# Patient Record
Sex: Female | Born: 1945 | Race: White | Hispanic: No | Marital: Married | Smoking: Never smoker
Health system: Southern US, Community
[De-identification: ages and names within clinical notes are randomized; demographics above are authoritative.]

## PROBLEM LIST (undated history)

## (undated) DIAGNOSIS — I10 Essential (primary) hypertension: Secondary | ICD-10-CM

## (undated) HISTORY — PX: JOINT REPLACEMENT: SHX530

---

## 2016-05-08 ENCOUNTER — Emergency Department: Payer: Medicare Other

## 2016-05-08 ENCOUNTER — Inpatient Hospital Stay
Admission: EM | Admit: 2016-05-08 | Discharge: 2016-05-12 | DRG: 871 | Disposition: A | Discharge status: Home Health | Payer: Medicare Other | Attending: Internal Medicine | Admitting: Internal Medicine

## 2016-05-08 ENCOUNTER — Encounter: Payer: Self-pay | Admitting: Emergency Medicine

## 2016-05-08 DIAGNOSIS — I272 Pulmonary hypertension, unspecified: Secondary | ICD-10-CM | POA: Diagnosis present

## 2016-05-08 DIAGNOSIS — Y9229 Other specified public building as the place of occurrence of the external cause: Secondary | ICD-10-CM | POA: Diagnosis not present

## 2016-05-08 DIAGNOSIS — I482 Chronic atrial fibrillation: Secondary | ICD-10-CM | POA: Diagnosis present

## 2016-05-08 DIAGNOSIS — I129 Hypertensive chronic kidney disease with stage 1 through stage 4 chronic kidney disease, or unspecified chronic kidney disease: Secondary | ICD-10-CM | POA: Diagnosis present

## 2016-05-08 DIAGNOSIS — A419 Sepsis, unspecified organism: Secondary | ICD-10-CM | POA: Diagnosis not present

## 2016-05-08 DIAGNOSIS — M79606 Pain in leg, unspecified: Secondary | ICD-10-CM | POA: Diagnosis present

## 2016-05-08 DIAGNOSIS — M546 Pain in thoracic spine: Secondary | ICD-10-CM | POA: Diagnosis present

## 2016-05-08 DIAGNOSIS — R6521 Severe sepsis with septic shock: Secondary | ICD-10-CM | POA: Diagnosis present

## 2016-05-08 DIAGNOSIS — W1839XA Other fall on same level, initial encounter: Secondary | ICD-10-CM | POA: Diagnosis present

## 2016-05-08 DIAGNOSIS — N183 Chronic kidney disease, stage 3 (moderate): Secondary | ICD-10-CM | POA: Diagnosis present

## 2016-05-08 DIAGNOSIS — Z95828 Presence of other vascular implants and grafts: Secondary | ICD-10-CM

## 2016-05-08 DIAGNOSIS — G629 Polyneuropathy, unspecified: Secondary | ICD-10-CM | POA: Diagnosis present

## 2016-05-08 DIAGNOSIS — Z6841 Body Mass Index (BMI) 40.0 and over, adult: Secondary | ICD-10-CM | POA: Diagnosis not present

## 2016-05-08 DIAGNOSIS — R0600 Dyspnea, unspecified: Secondary | ICD-10-CM | POA: Diagnosis not present

## 2016-05-08 DIAGNOSIS — N390 Urinary tract infection, site not specified: Secondary | ICD-10-CM | POA: Diagnosis present

## 2016-05-08 DIAGNOSIS — B9620 Unspecified Escherichia coli [E. coli] as the cause of diseases classified elsewhere: Secondary | ICD-10-CM | POA: Diagnosis present

## 2016-05-08 DIAGNOSIS — Z7901 Long term (current) use of anticoagulants: Secondary | ICD-10-CM

## 2016-05-08 DIAGNOSIS — I82419 Acute embolism and thrombosis of unspecified femoral vein: Secondary | ICD-10-CM | POA: Diagnosis present

## 2016-05-08 DIAGNOSIS — R34 Anuria and oliguria: Secondary | ICD-10-CM | POA: Diagnosis present

## 2016-05-08 DIAGNOSIS — E8770 Fluid overload, unspecified: Secondary | ICD-10-CM | POA: Diagnosis present

## 2016-05-08 DIAGNOSIS — N179 Acute kidney failure, unspecified: Secondary | ICD-10-CM | POA: Diagnosis present

## 2016-05-08 DIAGNOSIS — Z79899 Other long term (current) drug therapy: Secondary | ICD-10-CM

## 2016-05-08 DIAGNOSIS — E875 Hyperkalemia: Secondary | ICD-10-CM | POA: Diagnosis present

## 2016-05-08 DIAGNOSIS — N189 Chronic kidney disease, unspecified: Secondary | ICD-10-CM

## 2016-05-08 DIAGNOSIS — I959 Hypotension, unspecified: Secondary | ICD-10-CM | POA: Diagnosis present

## 2016-05-08 DIAGNOSIS — Z8249 Family history of ischemic heart disease and other diseases of the circulatory system: Secondary | ICD-10-CM | POA: Diagnosis not present

## 2016-05-08 DIAGNOSIS — E872 Acidosis: Secondary | ICD-10-CM | POA: Diagnosis present

## 2016-05-08 DIAGNOSIS — Z96652 Presence of left artificial knee joint: Secondary | ICD-10-CM | POA: Diagnosis present

## 2016-05-08 HISTORY — DX: Essential (primary) hypertension: I10

## 2016-05-08 LAB — CBC WITH DIFFERENTIAL/PLATELET
Basophils Absolute: 0.1 10*3/uL (ref 0–0.1)
Basophils Relative: 1 %
EOS ABS: 0.2 10*3/uL (ref 0–0.7)
Eosinophils Relative: 2 %
HEMATOCRIT: 29.6 % — AB (ref 35.0–47.0)
HEMOGLOBIN: 9.9 g/dL — AB (ref 12.0–16.0)
Lymphocytes Relative: 14 %
Lymphs Abs: 1.5 10*3/uL (ref 1.0–3.6)
MCH: 30.4 pg (ref 26.0–34.0)
MCHC: 33.6 g/dL (ref 32.0–36.0)
MCV: 90.5 fL (ref 80.0–100.0)
MONOS PCT: 9 %
Monocytes Absolute: 1 10*3/uL — ABNORMAL HIGH (ref 0.2–0.9)
NEUTROS ABS: 7.8 10*3/uL — AB (ref 1.4–6.5)
NEUTROS PCT: 74 %
Platelets: 254 10*3/uL (ref 150–440)
RBC: 3.26 MIL/uL — AB (ref 3.80–5.20)
RDW: 14.5 % (ref 11.5–14.5)
WBC: 10.5 10*3/uL (ref 3.6–11.0)

## 2016-05-08 LAB — BASIC METABOLIC PANEL
ANION GAP: 11 (ref 5–15)
BUN: 76 mg/dL — ABNORMAL HIGH (ref 6–20)
CO2: 18 mmol/L — AB (ref 22–32)
Calcium: 7.9 mg/dL — ABNORMAL LOW (ref 8.9–10.3)
Chloride: 100 mmol/L — ABNORMAL LOW (ref 101–111)
Creatinine, Ser: 6.7 mg/dL — ABNORMAL HIGH (ref 0.44–1.00)
GFR calc non Af Amer: 6 mL/min — ABNORMAL LOW (ref >60)
GFR, EST AFRICAN AMERICAN: 6 mL/min — AB (ref >60)
Glucose, Bld: 93 mg/dL (ref 65–99)
Potassium: 5.3 mmol/L — ABNORMAL HIGH (ref 3.5–5.1)
Sodium: 129 mmol/L — ABNORMAL LOW (ref 135–145)

## 2016-05-08 LAB — COMPREHENSIVE METABOLIC PANEL
ALBUMIN: 3.2 g/dL — AB (ref 3.5–5.0)
ALK PHOS: 51 U/L (ref 38–126)
ALT: 24 U/L (ref 14–54)
AST: 27 U/L (ref 15–41)
Anion gap: 15 (ref 5–15)
BILIRUBIN TOTAL: 0.4 mg/dL (ref 0.3–1.2)
BUN: 81 mg/dL — ABNORMAL HIGH (ref 6–20)
CALCIUM: 8.3 mg/dL — AB (ref 8.9–10.3)
CO2: 17 mmol/L — AB (ref 22–32)
CREATININE: 7.13 mg/dL — AB (ref 0.44–1.00)
Chloride: 100 mmol/L — ABNORMAL LOW (ref 101–111)
GFR calc non Af Amer: 5 mL/min — ABNORMAL LOW (ref >60)
GFR, EST AFRICAN AMERICAN: 6 mL/min — AB (ref >60)
GLUCOSE: 95 mg/dL (ref 65–99)
Potassium: 5.3 mmol/L — ABNORMAL HIGH (ref 3.5–5.1)
SODIUM: 132 mmol/L — AB (ref 135–145)
TOTAL PROTEIN: 6.9 g/dL (ref 6.5–8.1)

## 2016-05-08 LAB — PROCALCITONIN

## 2016-05-08 LAB — GLUCOSE, CAPILLARY: Glucose-Capillary: 94 mg/dL (ref 65–99)

## 2016-05-08 LAB — PHOSPHORUS: Phosphorus: 9.1 mg/dL — ABNORMAL HIGH (ref 2.5–4.6)

## 2016-05-08 LAB — LACTIC ACID, PLASMA: Lactic Acid, Venous: 0.8 mmol/L (ref 0.5–1.9)

## 2016-05-08 LAB — MAGNESIUM: Magnesium: 2.2 mg/dL (ref 1.7–2.4)

## 2016-05-08 LAB — TROPONIN I: Troponin I: <0.03 ng/mL (ref <0.03)

## 2016-05-08 MED ORDER — APIXABAN 5 MG PO TABS
5.0000 mg | ORAL_TABLET | Freq: Two times a day (BID) | ORAL | Status: DC
  Administered 2016-05-08 – 2016-05-12 (×8): 5 mg via ORAL
  Filled 2016-05-08: qty 1
  Filled 2016-05-08: qty 2
  Filled 2016-05-08: qty 1
  Filled 2016-05-08: qty 2
  Filled 2016-05-08: qty 1
  Filled 2016-05-08: qty 2
  Filled 2016-05-08: qty 1
  Filled 2016-05-08: qty 2

## 2016-05-08 MED ORDER — SODIUM CHLORIDE 0.9 % IV SOLN
0.0000 ug/min | INTRAVENOUS | Status: DC
  Administered 2016-05-08 – 2016-05-09 (×2): 20 ug/min via INTRAVENOUS
  Filled 2016-05-08 (×2): qty 1

## 2016-05-08 MED ORDER — SODIUM CHLORIDE 0.9 % IV BOLUS (SEPSIS)
1000.0000 mL | Freq: Once | INTRAVENOUS | Status: AC
  Administered 2016-05-08: 1000 mL via INTRAVENOUS

## 2016-05-08 MED ORDER — FAMOTIDINE IN NACL 20-0.9 MG/50ML-% IV SOLN
20.0000 mg | Freq: Two times a day (BID) | INTRAVENOUS | Status: DC
  Administered 2016-05-08: 20 mg via INTRAVENOUS
  Filled 2016-05-08 (×2): qty 50

## 2016-05-08 MED ORDER — VANCOMYCIN HCL IN DEXTROSE 1-5 GM/200ML-% IV SOLN
1000.0000 mg | Freq: Once | INTRAVENOUS | Status: AC
  Administered 2016-05-08: 1000 mg via INTRAVENOUS
  Filled 2016-05-08: qty 200

## 2016-05-08 MED ORDER — HYDROCORTISONE NA SUCCINATE PF 100 MG IJ SOLR
50.0000 mg | Freq: Three times a day (TID) | INTRAMUSCULAR | Status: DC
  Administered 2016-05-08 – 2016-05-10 (×6): 50 mg via INTRAVENOUS
  Filled 2016-05-08 (×6): qty 2

## 2016-05-08 MED ORDER — SODIUM CHLORIDE 0.9 % IV SOLN
INTRAVENOUS | Status: DC
  Administered 2016-05-08 – 2016-05-10 (×4): via INTRAVENOUS

## 2016-05-08 MED ORDER — PIPERACILLIN SOD-TAZOBACTAM SO 2.25 (2-0.25) G IV SOLR
3.3750 g | Freq: Once | INTRAVENOUS | Status: AC
  Administered 2016-05-08: 3.375 g via INTRAVENOUS
  Filled 2016-05-08 (×2): qty 3.38

## 2016-05-08 NOTE — Progress Notes (Signed)
Family Meeting Note  Advance Directive:no  Today a meeting took place with the Patient.  The following clinical team members were present during this meeting:MD  The following were discussed:Patient's diagnosis: , Patient's progosis: > 12 months and Goals for treatment: Full Code  Additional follow-up to be provided: Full code, continue goals of care discussion  Time spent during discussion:20 minutes  Delfino Lovett, MD

## 2016-05-08 NOTE — Progress Notes (Signed)
Met with patient's family at her bedside. They expressed the desire to have patient transferred to [facility] in [place], [place] so she can be close to home. Patient has been hospitalized multiple times in this hospital. Nursing supervisor at [facility] contacted(Mr. Davene Costain [phone]) and bed request initiated. They want [place] to contact them once patient is stable for transfer. Family updated on transfer status   Keon Benscoter S. [facility] ANP-BC Pulmonary and Critical Care Medicine [facility] Pager [phone] or [phone]

## 2016-05-08 NOTE — Consult Note (Signed)
[facility] Critical Care Medicine Consultation    SYNOPSIS   71 yo female with CKD now presents with weakness, severe hypotension of uncertain etiology, AKI on CKD.   ASSESSMENT/PLAN     CARDIOVASCULAR A: Septic shock with hypotension.  Afib on eliquis.  Essential hypertension.  P:  --Continue pressors, IVF resuscitation.  --Will add stress dose steroids.  --Will check LE dopplers, given long car ride, and recent knee surgery. However is on long term eliquis, therefore PE seems less likely.  --No CT angio secondary to ARF.      RENAL A:  ARF on CKD.  P:   --Has CKD, followed by nephro outpatient.  --Will consider nephrology consult if renal function does not improve.    INTAKE / OUTPUT:  Intake/Output Summary (Last 24 hours) at 05/08/16 1800 Last data filed at 05/08/16 1759  Gross per 24 hour  Intake             2000 ml  Output                0 ml  Net             2000 ml    GASTROINTESTINAL A:  -- P:   --  HEMATOLOGIC A:  -- P:  --  INFECTIOUS A:  ?Possible septic shock.  P:   Empiric abx.  Cultures.   Micro/culture results:  BCx2 -- UC -- Sputum--  Antibiotics: Vancomycin 4/2>>  Zosyn 4/2>>  ENDOCRINE A:  Monitor glucose   P:   --  NEUROLOGIC A:  -- P:   RASS goal: -- --   MAJOR EVENTS/TEST RESULTS:   Best Practices  DVT Prophylaxis: -- GI Prophylaxis: --  ---------------------------------------  ---------------------------------------   Name: Armandina Iman MRN: [id number] DOB: [birth year]    ADMISSION DATE:  05/08/2016 CONSULTATION DATE:  05/08/16  REFERRING MD :  Dr. Sherryll Burger  CHIEF COMPLAINT:  dyspnea   HISTORY OF PRESENT ILLNESS:    The patient is a 71 yo female, she lives in [place], and was travelling with her family back from [place], they had been on the road for about 2.5 hours when she stopped at rest stop for routine stop. They note that maybe she has been feeling somewhat woozy this am.  When  at the rest stop she felt woozy and her legs gave way though she did not fall. She was noted to be dizzy therefore EMS was called.  She has no history of PE or DVT. She had a knee replaced about 5 months ago. She has CKD and sees a renal specialist where she lives, over the past few months her renal function has ranged from CKD 1 to 3.  Currently she is very tired, but she has no particular complaints, she denies chest pain or pressure. She is laying in bed, somewhat sleepy but in no distess and on room air, with sat of 99%.  However she has had persistent hypotension since her arrival. She received 3L of IVF, without continue hypotension, therefore she has been started on neosynephrine, her BP is now 94/53.   PAST MEDICAL HISTORY :  Past Medical History:  Diagnosis Date  . Hypertension    Past Surgical History:  Procedure Laterality Date  . JOINT REPLACEMENT     left knee surgery   Prior to Admission medications   Medication Sig Start Date End Date Taking? Authorizing Provider  amitriptyline (ELAVIL) 10 MG tablet Take 20 mg by mouth at bedtime.  Yes Historical Provider, MD  apixaban (ELIQUIS) 5 MG TABS tablet Take 5 mg by mouth 2 (two) times daily.   Yes Historical Provider, MD  diltiazem (DILACOR XR) 180 MG 24 hr capsule Take 180 mg by mouth daily.   Yes Historical Provider, MD  gabapentin (NEURONTIN) 600 MG tablet Take 600 mg by mouth 3 (three) times daily.   Yes Historical Provider, MD  lisinopril-hydrochlorothiazide (PRINZIDE,ZESTORETIC) 20-25 MG tablet Take 1 tablet by mouth daily.   Yes Historical Provider, MD  metoprolol succinate (TOPROL-XL) 100 MG 24 hr tablet Take 100 mg by mouth daily. Take with or immediately following a meal.   Yes Historical Provider, MD   No Known Allergies  FAMILY HISTORY:  No family history on file. SOCIAL HISTORY:  has no tobacco, alcohol, and drug history on file.  REVIEW OF SYSTEMS:   Constitutional: Feels well. Cardiovascular: No chest pain.    Pulmonary: Denies dyspnea.   The remainder of systems were reviewed and were found to be negative other than what is documented in the HPI.    VITAL SIGNS: Temp:  [97.7 F (36.5 C)] 97.7 F (36.5 C) (04/02 1421) Pulse Rate:  [56-131] 82 (04/02 1746) Resp:  [10-18] 10 (04/02 1746) BP: (76-122)/(39-103) 94/53 (04/02 1746) SpO2:  [75 %-99 %] 96 % (04/02 1746) Weight:  [305 lb (138.3 kg)] 305 lb (138.3 kg) (04/02 1423) HEMODYNAMICS:   VENTILATOR SETTINGS:   INTAKE / OUTPUT:  Intake/Output Summary (Last 24 hours) at 05/08/16 1800 Last data filed at 05/08/16 1759  Gross per 24 hour  Intake             2000 ml  Output                0 ml  Net             2000 ml    Physical Examination:   VS: BP (!) 94/53   Pulse 82   Temp 97.7 F (36.5 C) (Oral)   Resp 10   Ht  (1.702 m)   Wt (!) 305 lb (138.3 kg)   SpO2 96%   BMI 47.77 kg/m   General Appearance: No distress  Neuro:without focal findings, mental status, speech normal,. HEENT: PERRLA, EOM intact, no ptosis, no other lesions noticed;  Pulmonary: normal breath sounds., decreased air entry bilaterally  CardiovascularNormal S1,S2.  No m/r/g.    Abdomen: Benign, Soft, non-tender, No masses, hepatosplenomegaly,  Renal:  No costovertebral tenderness  GU:  Not performed at this time. Endoc: No evident thyromegaly, no signs of acromegaly. Skin:   warm, no rashes, no ecchymosis  Extremities: normal, no cyanosis, clubbing, no edema, warm with reduced capillary refill.    LABS: Reviewed   LABORATORY PANEL:   CBC  Recent Labs Lab 05/08/16 1432  WBC 10.5  HGB 9.9*  HCT 29.6*  PLT 254    Chemistries   Recent Labs Lab 05/08/16 1432  NA 132*  K 5.3*  CL 100*  CO2 17*  GLUCOSE 95  BUN 81*  CREATININE 7.13*  CALCIUM 8.3*  AST 27  ALT 24  ALKPHOS 51  BILITOT 0.4    No results for input(s): GLUCAP in the last 168 hours. No results for input(s): PHART, PCO2ART, PO2ART in the last 168 hours.  Recent  Labs Lab 05/08/16 1432  AST 27  ALT 24  ALKPHOS 51  BILITOT 0.4  ALBUMIN 3.2*    Cardiac Enzymes  Recent Labs Lab 05/08/16 1432  TROPONINI <0.03  RADIOLOGY:  Dg Chest 1 View  Result Date: 05/08/2016 CLINICAL DATA:  Weakness, multiple falls.  Alert and oriented. EXAM: CHEST 1 VIEW COMPARISON:  None. FINDINGS: There is mild right basilar atelectasis. There is no focal parenchymal opacity. There is no pleural effusion or pneumothorax. The heart and mediastinal contours are unremarkable. There is thoracic aortic atherosclerosis. The osseous structures are unremarkable. IMPRESSION: No active disease. Electronically Signed   By: Elige Ko   On: 05/08/2016 14:54       --Wells Guiles, MD.  Board Certified in Internal Medicine, Pulmonary Medicine, Critical Care Medicine, and Sleep Medicine.  ICU Pager [phone] [place] [facility] and Critical [facility] Office Number: [phone]  Santiago Glad, M.D.  Billy Fischer, M.D   05/08/2016, 6:00 PM

## 2016-05-08 NOTE — Progress Notes (Signed)
ANTICOAGULATION CONSULT NOTE - Initial Consult  Pharmacy Consult for Eliquis Indication: atrial fibrillation  No Known Allergies  Patient Measurements: Height:  (170.2 cm) Weight: (!) 305 lb (138.3 kg) IBW/kg (Calculated) : 61.6 Heparin Dosing Weight:   Vital Signs: Temp: 97.7 F (36.5 C) (04/02 1421) Temp Source: Oral (04/02 1421) BP: 95/69 (04/02 1815) Pulse Rate: 55 (04/02 1815)  Labs:  Recent Labs  05/08/16 1432  HGB 9.9*  HCT 29.6*  PLT 254  CREATININE 7.13*  TROPONINI <0.03    Estimated Creatinine Clearance: 10.5 mL/min (A) (by C-G formula based on SCr of 7.13 mg/dL (H)).   Medical History: Past Medical History:  Diagnosis Date  . Hypertension     Medications:  Prescriptions Prior to Admission  Medication Sig Dispense Refill Last Dose  . amitriptyline (ELAVIL) 10 MG tablet Take 20 mg by mouth at bedtime.   05/07/2016 at 2000  . apixaban (ELIQUIS) 5 MG TABS tablet Take 5 mg by mouth 2 (two) times daily.   05/08/2016 at 0800  . diltiazem (DILACOR XR) 180 MG 24 hr capsule Take 180 mg by mouth daily.   Unknown at Unknown  . gabapentin (NEURONTIN) 600 MG tablet Take 600 mg by mouth 3 (three) times daily.   Unknown at Unknown  . lisinopril-hydrochlorothiazide (PRINZIDE,ZESTORETIC) 20-25 MG tablet Take 1 tablet by mouth daily.   05/08/2016 at 0800  . metoprolol succinate (TOPROL-XL) 100 MG 24 hr tablet Take 100 mg by mouth daily. Take with or immediately following a meal.   Unknown at Unknown    Assessment: TBW = 138.3 kg, Age = 70 years, CrCl = 10.5 ml/min  Goal of Therapy:  prophylaxis of thromboembolism   Plan:  Eliquis 2.5 mg PO BID originally ordered.  Will adjust to Eliquis 5 mg PO BID.  OK'd with Dr Donette Larry.   Tamaira Ciriello D 05/08/2016,6:56 PM

## 2016-05-08 NOTE — Progress Notes (Signed)
Pharmacy Antibiotic Note  Lindsay Mathews is a 72 y.o. female admitted on 05/08/2016 with sepsis.  Pharmacy has been consulted for Zosyn and vancomycin dosing.  Plan: 1. Zosyn 3.375 gm IV Q12H EI 2. Vancomycin 1 gm IV x 1 in ED, will give additional 1 gm IV x 1 for a total 2 gm load x 1, followed in approximately 72 hours by vancomycin 1.5 gm IV Q72H, predicted trough 15 mcg/mL. Pharmacy will continue to follow and adjust as needed to maintain trough 15 to 20 mcg/mL.   Vd 64.4 L, Ke 0.013 hr-1, T1/2 52.8 hr  Height:  (170.2 cm) Weight: (!) 305 lb (138.3 kg) IBW/kg (Calculated) : 61.6  Temp (24hrs), Avg:97.7 F (36.5 C), Min:97.7 F (36.5 C), Max:97.7 F (36.5 C)   Recent Labs Lab 05/08/16 1432  WBC 10.5  CREATININE 7.13*    Estimated Creatinine Clearance: 10.5 mL/min (A) (by C-G formula based on SCr of 7.13 mg/dL (H)).    No Known Allergies  Thank you for allowing pharmacy to be a part of this patient's care.  Carola Frost, Pharm.D., BCPS Clinical Pharmacist 05/08/2016 6:16 PM

## 2016-05-08 NOTE — ED Triage Notes (Signed)
Pt presents to ED via EMS c/o of a her left knee "buckling" which caused her to fall. Pt also has back pain with BP <85 systolic. Alert and oriented x4

## 2016-05-08 NOTE — ED Provider Notes (Signed)
[facility] Emergency Department Provider Note  ____________________________________________   First MD Initiated Contact with Patient 05/08/16 1421     (approximate)  I have reviewed the triage vital signs and the nursing notes.   HISTORY  Chief Complaint Hypotension; Fall; and Knee Injury   HPI Lindsay Mathews is a 71 y.o. female with a history of hypertension who is presenting to the emergency department with a near syncopal episode. She says that she was getting up to walk from her car in a rest stop bathroom when she felt weak in her knees and fell to the ground. She says that she hit a bar on the bathroom wall to her upper back and is now having aching pain to the upper back. However, otherwise she is denying any pain. Denies that she completely passed out at that time. Says that she had had a biscuit and some water today which is normal for her daily routine. She denies any recent nausea vomiting or diarrhea. Said that she doesn't a history of kidney failure and has been taken off of her metformin. To the best of the memory of her daughter and the patient her last creatinine several months ago was about 2. Patient says that when she is lying in the bed that she is a symptomatic but when she gets up to walk that her symptoms began with a weakness in her bilateral lower extremities. The patient was on a trip from [place] back home on the [place] when this event happened. She denies any recent changes in her medications and says that she has been tolerating her medications for some time now. Recent knee replacement several months ago with sepsis thereafter and atrial fibrillation. Patient is currently on eliquis. Family said that the source was never identified for the sepsis but was not from the joint as far as anyone could tell. The replace was done to the left knee.   Past Medical History:  Diagnosis Date  . Hypertension     There are no active  problems to display for this patient.   Past Surgical History:  Procedure Laterality Date  . JOINT REPLACEMENT     left knee surgery    Prior to Admission medications   Not on File    Allergies Patient has no allergy information on record.  No family history on file.  Social History Social History  Substance Use Topics  . Smoking status: Not on file  . Smokeless tobacco: Not on file  . Alcohol use Not on file    Review of Systems Constitutional: No fever/chills Eyes: No visual changes. ENT: No sore throat. Cardiovascular: Denies chest pain. Respiratory: Denies shortness of breath. Gastrointestinal: No abdominal pain.  No nausea, no vomiting.  No diarrhea.  No constipation. Genitourinary: Negative for dysuria. Musculoskeletal:as above Skin: Negative for rash. Neurological: Negative for headaches, focal weakness or numbness.  10-point ROS otherwise negative.  ____________________________________________   PHYSICAL EXAM:  VITAL SIGNS: ED Triage Vitals  Enc Vitals Group     BP 05/08/16 1421 (!) 82/41     Pulse Rate 05/08/16 1421 63     Resp --      Temp 05/08/16 1421 97.7 F (36.5 C)     Temp Source 05/08/16 1421 Oral     SpO2 05/08/16 1421 94 %     Weight 05/08/16 1423 (!) 305 lb (138.3 kg)     Height 05/08/16 1423  (1.702 m)     Head Circumference --  Peak Flow --      Pain Score --      Pain Loc --      Pain Edu? --      Excl. in GC? --     Constitutional: Alert and oriented. Well appearing and in no acute distress. Eyes: Conjunctivae are normal. PERRL. EOMI. Head: Atraumatic. Nose: No congestion/rhinnorhea. Mouth/Throat: Mucous membranes are moist.   Neck: No stridor.   Cardiovascular: Normal rate, regular rhythm. Grossly normal heart sounds.  Good peripheral circulation With equal and bilateral dorsalis pedis pulses. Respiratory: Normal respiratory effort.  No retractions. Lungs CTAB. Gastrointestinal: Soft and nontender. No  distention.  Musculoskeletal: No lower extremity tenderness.  Moderate bilateral lower extremity edema which the patient says is been increasing since she has been sitting in her car.  No joint effusions. Left knee with anterior arthroplasty scar which is well-healed without any surrounding erythema, induration or pus. No effusion palpated. Full range of motion to the right lower extremity. Able to range the left lower extremity fully but with pain to the left knee and slight weakness which her and her daughter say that has been persistent since she had the knee replacement performed. Neurologic:  Normal speech and language. No gross focal neurologic deficits are appreciated.  Skin:  Skin is warm, dry and intact. No rash noted. Psychiatric: Mood and affect are normal. Speech and behavior are normal.  ____________________________________________   LABS (all labs ordered are listed, but only abnormal results are displayed)  Labs Reviewed  CBC WITH DIFFERENTIAL/PLATELET - Abnormal; Notable for the following:       Result Value   RBC 3.26 (*)    Hemoglobin 9.9 (*)    HCT 29.6 (*)    Neutro Abs 7.8 (*)    Monocytes Absolute 1.0 (*)    All other components within normal limits  COMPREHENSIVE METABOLIC PANEL - Abnormal; Notable for the following:    Sodium 132 (*)    Potassium 5.3 (*)    Chloride 100 (*)    CO2 17 (*)    BUN 81 (*)    Creatinine, Ser 7.13 (*)    Calcium 8.3 (*)    Albumin 3.2 (*)    GFR calc non Af Amer 5 (*)    GFR calc Af Amer 6 (*)    All other components within normal limits  TROPONIN I  URINALYSIS, COMPLETE (UACMP) WITH MICROSCOPIC   ____________________________________________  EKG  ED ECG REPORT I, Arelia Longest, the attending physician, personally viewed and interpreted this ECG.   Date: 05/08/2016  EKG Time: 1423  Rate: 65  Rhythm: normal sinus rhythm  Axis: Normal  Intervals:none  ST&T Change: No ST segment elevation or depression. No abnormal  T-wave inversion.  ____________________________________________  RADIOLOGY  DG Chest 1 View (Final result)  Result time 05/08/16 14:54:20  Final result by Joellyn Haff, MD (05/08/16 14:54:20)           Narrative:   CLINICAL DATA: Weakness, multiple falls. Alert and oriented.  EXAM: CHEST 1 VIEW  COMPARISON: None.  FINDINGS: There is mild right basilar atelectasis. There is no focal parenchymal opacity. There is no pleural effusion or pneumothorax. The heart and mediastinal contours are unremarkable. There is thoracic aortic atherosclerosis.  The osseous structures are unremarkable.  IMPRESSION: No active disease.   Electronically Signed By: Elige Ko On: 05/08/2016 14:54          ____________________________________________   PROCEDURES  Procedure(s) performed:   Procedures  Critical Care performed:   ____________________________________________   INITIAL IMPRESSION / ASSESSMENT AND PLAN / ED COURSE  Pertinent labs & imaging results that were available during my care of the patient were reviewed by me and considered in my medical decision making (see chart for details).  ----------------------------------------- 4:05 PM on 05/08/2016 -----------------------------------------  Patient found to have acute on chronic renal failure with a creatinine of 7 and BUN in the 80s. She also appears to be fluid responsive with her blood pressure now in the 80s. She will be admitted to the hospital. Signed out to Dr. Elpidio Anis. The diagnosis as well as treatment plan was explained to the patient and family and they're understanding and willing to comply.      ____________________________________________   FINAL CLINICAL IMPRESSION(S) / ED DIAGNOSES  Hypotension. Acute on chronic kidney failure.    NEW MEDICATIONS STARTED DURING THIS VISIT:  New Prescriptions   No medications on file     Note:  This document was prepared using Dragon voice  recognition software and may include unintentional dictation errors.    Myrna Blazer, MD 05/08/16 [phone]

## 2016-05-08 NOTE — H&P (Addendum)
[facility] - [place] at [facility]   PATIENT NAME: Lindsay Mathews    MR#:  [id number]  DATE OF BIRTH:  19-Jul-1945  DATE OF ADMISSION:  05/08/2016  PRIMARY CARE PHYSICIAN: PROVIDER NOT IN SYSTEM Normajean Glasgow MD  REQUESTING/REFERRING PHYSICIAN: Myrna Blazer, MD  CHIEF COMPLAINT:   Chief Complaint  Patient presents with  . Hypotension  . Fall  . Knee Injury   HISTORY OF PRESENT ILLNESS:  Lindsay Mathews  is a 71 y.o. 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 She says that she was getting up to walk from her car in a rest stop bathroom when she felt weak in her knees and fell to the ground. She says that she hit a bar on the bathroom wall to her upper back and is now having aching pain to the upper back. However, otherwise she is denying any pain. Denies that she completely passed out at that time. Says that she had had a biscuit and some water today which is normal for her daily routine. She denies any recent fever, nausea, vomiting or diarrhea. Said that she doesn't a history of kidney failure and has been taken off of her metformin. To the best of the memory of her daughter and the patient her last creatinine several months ago was about 2 and may have gone down to 1 while she was at rehab after knee surgery per her daughter.  The patient was on a trip from [place] back home to [place] when this event happened while at rest area. Had left knee replacement in November 17 and had to change knee cap in Jan'18 (?Sepsis/fall) and developed atrial fibrillation for which she is on eliquis.  PAST MEDICAL HISTORY:   Past Medical History:  Diagnosis Date  . Hypertension   A. Fib Fibromyalgia PAST SURGICAL HISTORY:   Past Surgical History:  Procedure Laterality Date  . JOINT REPLACEMENT     left knee surgery   SOCIAL HISTORY:   Social History  Substance Use  Topics  . Smoking status: Not on file  . Smokeless tobacco: Not on file  . Alcohol use Not on file  retired from [employer] FAMILY HISTORY:  No family history on file. Mom - HTN, SEPSIS (died from that) Sisters: MI DRUG ALLERGIES:  No Known Allergies REVIEW OF SYSTEMS:   Review of Systems  Constitutional: Positive for malaise/fatigue. Negative for chills, fever and weight loss.  HENT: Negative for nosebleeds and sore throat.   Eyes: Negative for blurred vision.  Respiratory: Negative for cough, shortness of breath and wheezing.   Cardiovascular: Negative for chest pain, orthopnea, leg swelling and PND.  Gastrointestinal: Negative for abdominal pain, constipation, diarrhea, heartburn, nausea and vomiting.  Genitourinary: Negative for dysuria and urgency.  Musculoskeletal: Positive for falls and joint pain. Negative for back pain.  Skin: Negative for rash.  Neurological: Positive for dizziness and weakness. Negative for speech change, focal weakness and headaches.  Endo/Heme/Allergies: Does not bruise/bleed easily.  Psychiatric/Behavioral: Negative for depression.   MEDICATIONS AT HOME:   Current Meds  Medication Sig  . amitriptyline (ELAVIL) 10 MG tablet Take 20 mg by mouth at bedtime.  Marland Kitchen apixaban (ELIQUIS) 5 MG TABS tablet Take 5 mg by mouth 2 (two) times daily.  Marland Kitchen diltiazem (DILACOR XR) 180 MG 24 hr capsule Take 180 mg by mouth daily.  Marland Kitchen gabapentin (NEURONTIN) 600 MG tablet Take 600 mg by  mouth 3 (three) times daily.  Marland Kitchen lisinopril-hydrochlorothiazide (PRINZIDE,ZESTORETIC) 20-25 MG tablet Take 1 tablet by mouth daily.  . metoprolol succinate (TOPROL-XL) 100 MG 24 hr tablet Take 100 mg by mouth daily. Take with or immediately following a meal.   VITAL SIGNS:  Blood pressure (!) 95/39, pulse (!) 131, temperature 97.7 F (36.5 C), temperature source Oral, resp. rate 14, height  (1.702 m), weight (!) 138.3 kg (305 lb), SpO2 (!) 85 %. PHYSICAL EXAMINATION:  Physical  Exam  GENERAL:  71 y.o.-year-old patient lying in the bed with no acute distress.  EYES: Pupils equal, round, reactive to light and accommodation. No scleral icterus. Extraocular muscles intact.  HEENT: Head atraumatic, normocephalic. Oropharynx and nasopharynx clear.  NECK:  Supple, no jugular venous distention. No thyroid enlargement, no tenderness.  LUNGS: Normal breath sounds bilaterally, no wheezing, rales,rhonchi or crepitation. No use of accessory muscles of respiration.  CARDIOVASCULAR: S1, S2 normal. No murmurs, rubs, or gallops.  ABDOMEN: Soft, nontender, nondistended. Bowel sounds present. No organomegaly or mass.  EXTREMITIES: No pedal edema, cyanosis, or clubbing.  NEUROLOGIC: Cranial nerves II through XII are intact. Muscle strength 5/5 in all extremities. Sensation intact. Gait not checked.  PSYCHIATRIC: The patient is alert and oriented x 3.  SKIN: No obvious rash, lesion, or ulcer.  LABORATORY PANEL:   CBC  Recent Labs Lab 05/08/16 1432  WBC 10.5  HGB 9.9*  HCT 29.6*  PLT 254   ------------------------------------------------------------------------------------------------------------------  Chemistries   Recent Labs Lab 05/08/16 1432  NA 132*  K 5.3*  CL 100*  CO2 17*  GLUCOSE 95  BUN 81*  CREATININE 7.13*  CALCIUM 8.3*  AST 27  ALT 24  ALKPHOS 51  BILITOT 0.4   ------------------------------------------------------------------------------------------------------------------  Cardiac Enzymes  Recent Labs Lab 05/08/16 1432  TROPONINI <0.03   ------------------------------------------------------------------------------------------------------------------  RADIOLOGY:  Dg Chest 1 View  Result Date: 05/08/2016 CLINICAL DATA:  Weakness, multiple falls.  Alert and oriented. EXAM: CHEST 1 VIEW COMPARISON:  None. FINDINGS: There is mild right basilar atelectasis. There is no focal parenchymal opacity. There is no pleural effusion or pneumothorax.  The heart and mediastinal contours are unremarkable. There is thoracic aortic atherosclerosis. The osseous structures are unremarkable. IMPRESSION: No active disease. Electronically Signed   By: Elige Ko   On: 05/08/2016 14:54   IMPRESSION AND PLAN:  71 year old female with a known history of hypertension, fibromyalgia, being admitted for near syncope and persistent hypotension  * Near syncope - Likely due to severe persistent hypotension  * severe persistent hypotension - Her blood pressure is still in 70s and 80s - Holding all her blood pressure medication - She has had already received 2 L of IV fluid boluses, we will start her on normal saline at 125 cc an hour - Admit her to stepdown unit - Phenylephrine drip per intensivist - Case discussed with e-Link physician, Dr. Dellie Catholic - Intensivist c/s - Also holding her eliquis not knowing If she would have any bleed - she is in normal sinus rhythm (was started for A.fib)  * Acute on chronic kidney disease 2/3 - may be her baseline creatinine is around 2, per discussion with her family members - This could be ATN due to severe hypotension - We will hydrate her aggressively, her creatinine today is 7.1 - Avoid any nephrotoxic medications - Nephrology consultation.  Case discussed with Dr. Cherylann Ratel  * Hyperkalemia - No EKG changes - We will repeat labs, my hope is with hydration.  Her kidney function  will improve and so will her potassium    All the records are reviewed and case discussed with ED provider. Management plans discussed with the patient, family (Discussed with her daughter Misty Stanley and her husband Chanetta Marshall at bedside) and they are in agreement.  CODE STATUS: FULL CODE  TOTAL TIME TAKING CARE OF THIS PATIENT: 45 minutes.    Delfino Lovett M.D on 05/08/2016 at 4:58 PM  Between 7am to 6pm - Pager - [phone]  After 6pm go to [email or web address] - Social research officer, government  [facility] [place] [facility]  Office   [phone]  CC: Primary care physician; PROVIDER NOT IN SYSTEM   Note: This dictation was prepared with Dragon dictation along with smaller phrase technology. Any transcriptional errors that result from this process are unintentional.

## 2016-05-08 NOTE — ED Notes (Signed)
Hooked pt up to monitor.

## 2016-05-09 ENCOUNTER — Inpatient Hospital Stay: Payer: Medicare Other

## 2016-05-09 ENCOUNTER — Inpatient Hospital Stay (HOSPITAL_COMMUNITY)
Admit: 2016-05-09 | Discharge: 2016-05-09 | Disposition: A | Discharge status: Home / Self Care | Payer: Medicare Other | Attending: Internal Medicine | Admitting: Internal Medicine

## 2016-05-09 DIAGNOSIS — I959 Hypotension, unspecified: Secondary | ICD-10-CM

## 2016-05-09 DIAGNOSIS — N189 Chronic kidney disease, unspecified: Secondary | ICD-10-CM

## 2016-05-09 DIAGNOSIS — R0600 Dyspnea, unspecified: Secondary | ICD-10-CM

## 2016-05-09 DIAGNOSIS — N179 Acute kidney failure, unspecified: Secondary | ICD-10-CM

## 2016-05-09 LAB — PROCALCITONIN: Procalcitonin: <0.1 ng/mL

## 2016-05-09 LAB — BASIC METABOLIC PANEL
ANION GAP: 9 (ref 5–15)
BUN: 74 mg/dL — ABNORMAL HIGH (ref 6–20)
CALCIUM: 7.4 mg/dL — AB (ref 8.9–10.3)
CO2: 17 mmol/L — ABNORMAL LOW (ref 22–32)
Chloride: 105 mmol/L (ref 101–111)
Creatinine, Ser: 6.15 mg/dL — ABNORMAL HIGH (ref 0.44–1.00)
GFR calc non Af Amer: 6 mL/min — ABNORMAL LOW (ref >60)
GFR, EST AFRICAN AMERICAN: 7 mL/min — AB (ref >60)
GLUCOSE: 116 mg/dL — AB (ref 65–99)
Potassium: 5.7 mmol/L — ABNORMAL HIGH (ref 3.5–5.1)
SODIUM: 131 mmol/L — AB (ref 135–145)

## 2016-05-09 LAB — URINALYSIS, COMPLETE (UACMP) WITH MICROSCOPIC
Bilirubin Urine: NEGATIVE
Glucose, UA: NEGATIVE mg/dL
Ketones, ur: NEGATIVE mg/dL
Nitrite: NEGATIVE
PROTEIN: NEGATIVE mg/dL
SQUAMOUS EPITHELIAL / LPF: NONE SEEN
Specific Gravity, Urine: 1.008 (ref 1.005–1.030)
pH: 5 (ref 5.0–8.0)

## 2016-05-09 LAB — CBC
HCT: 28.3 % — ABNORMAL LOW (ref 35.0–47.0)
Hemoglobin: 9.5 g/dL — ABNORMAL LOW (ref 12.0–16.0)
MCH: 30.4 pg (ref 26.0–34.0)
MCHC: 33.6 g/dL (ref 32.0–36.0)
MCV: 90.3 fL (ref 80.0–100.0)
PLATELETS: 221 10*3/uL (ref 150–440)
RBC: 3.13 MIL/uL — ABNORMAL LOW (ref 3.80–5.20)
RDW: 14.2 % (ref 11.5–14.5)
WBC: 7.3 10*3/uL (ref 3.6–11.0)

## 2016-05-09 LAB — LACTIC ACID, PLASMA: Lactic Acid, Venous: 0.7 mmol/L (ref 0.5–1.9)

## 2016-05-09 LAB — TROPONIN I
Troponin I: <0.03 ng/mL (ref <0.03)
Troponin I: <0.03 ng/mL (ref <0.03)

## 2016-05-09 LAB — PROTEIN / CREATININE RATIO, URINE
CREATININE, URINE: 72 mg/dL
Protein Creatinine Ratio: 0.36 mg/mg{Cre} — ABNORMAL HIGH (ref 0.00–0.15)
TOTAL PROTEIN, URINE: 26 mg/dL

## 2016-05-09 LAB — MRSA PCR SCREENING: MRSA by PCR: NEGATIVE

## 2016-05-09 LAB — MAGNESIUM: MAGNESIUM: 2.1 mg/dL (ref 1.7–2.4)

## 2016-05-09 MED ORDER — DEXTROSE 5 % IV SOLN
3.3750 g | Freq: Two times a day (BID) | INTRAVENOUS | Status: DC
  Filled 2016-05-09: qty 3.38

## 2016-05-09 MED ORDER — VANCOMYCIN HCL IN DEXTROSE 1-5 GM/200ML-% IV SOLN
1000.0000 mg | Freq: Once | INTRAVENOUS | Status: AC
  Administered 2016-05-09: 1000 mg via INTRAVENOUS
  Filled 2016-05-09: qty 200

## 2016-05-09 MED ORDER — PATIROMER SORBITEX CALCIUM 8.4 G PO PACK
16.8000 g | PACK | Freq: Every day | ORAL | Status: DC
  Administered 2016-05-09 – 2016-05-10 (×2): 16.8 g via ORAL
  Filled 2016-05-09 (×2): qty 4

## 2016-05-09 MED ORDER — VANCOMYCIN HCL 10 G IV SOLR: 1500.0000 mg | INTRAVENOUS | Status: DC

## 2016-05-09 MED ORDER — ACETAMINOPHEN 325 MG PO TABS
650.0000 mg | ORAL_TABLET | Freq: Four times a day (QID) | ORAL | Status: DC | PRN
  Administered 2016-05-09 – 2016-05-12 (×5): 650 mg via ORAL
  Filled 2016-05-09 (×6): qty 2

## 2016-05-09 MED ORDER — DOCUSATE SODIUM 100 MG PO CAPS
100.0000 mg | ORAL_CAPSULE | Freq: Two times a day (BID) | ORAL | Status: DC | PRN
  Administered 2016-05-11: 100 mg via ORAL
  Filled 2016-05-09: qty 1

## 2016-05-09 MED ORDER — PIPERACILLIN SOD-TAZOBACTAM SO 2.25 (2-0.25) G IV SOLR
3.3750 g | Freq: Two times a day (BID) | INTRAVENOUS | Status: DC
  Administered 2016-05-09 – 2016-05-10 (×4): 3.375 g via INTRAVENOUS
  Filled 2016-05-09 (×6): qty 3.38

## 2016-05-09 MED ORDER — FAMOTIDINE IN NACL 20-0.9 MG/50ML-% IV SOLN
20.0000 mg | INTRAVENOUS | Status: DC
  Administered 2016-05-09: 20 mg via INTRAVENOUS
  Filled 2016-05-09: qty 50

## 2016-05-09 NOTE — Progress Notes (Signed)
[facility] - [place] at [facility]   PATIENT NAME: Lindsay Mathews    MR#:  [id number]  DATE OF BIRTH:  [birth year]  SUBJECTIVE:   Patient doing well this morning. She denies chest pain or shortness of breath. She would like to go home if possible  REVIEW OF SYSTEMS:    Review of Systems  Constitutional: Negative.  Negative for chills, fever and malaise/fatigue.  HENT: Negative.  Negative for ear discharge, ear pain, hearing loss, nosebleeds and sore throat.   Eyes: Negative.  Negative for blurred vision and pain.  Respiratory: Negative.  Negative for cough, hemoptysis, shortness of breath and wheezing.   Cardiovascular: Negative.  Negative for chest pain, palpitations and leg swelling.  Gastrointestinal: Negative.  Negative for abdominal pain, blood in stool, diarrhea, nausea and vomiting.  Genitourinary: Negative.  Negative for dysuria.  Musculoskeletal: Negative.  Negative for back pain.  Skin: Negative.   Neurological: Negative for dizziness, tremors, speech change, focal weakness, seizures and headaches.  Endo/Heme/Allergies: Negative.  Does not bruise/bleed easily.  Psychiatric/Behavioral: Negative.  Negative for depression, hallucinations and suicidal ideas.    Tolerating Diet: yes      DRUG ALLERGIES:  No Known Allergies  VITALS:  Blood pressure (!) 107/57, pulse 60, temperature 97.5 F (36.4 C), temperature source Oral, resp. rate 16, height  (1.702 m), weight (!) 140.8 kg (310 lb 6.5 oz), SpO2 100 %.  PHYSICAL EXAMINATION:   Physical Exam  Constitutional: She is oriented to person, place, and time and well-developed, well-nourished, and in no distress. No distress.  HENT:  Head: Normocephalic.  Eyes: No scleral icterus.  Neck: Normal range of motion. Neck supple. No JVD present. No tracheal deviation present.  Cardiovascular: Normal rate, regular rhythm and normal heart sounds.  Exam reveals no gallop and no friction rub.   No  murmur heard. Pulmonary/Chest: Effort normal and breath sounds normal. No respiratory distress. She has no wheezes. She has no rales. She exhibits no tenderness.  Abdominal: Soft. Bowel sounds are normal. She exhibits no distension and no mass. There is no tenderness. There is no rebound and no guarding.  Musculoskeletal: Normal range of motion. She exhibits no edema.  Neurological: She is alert and oriented to person, place, and time.  Skin: Skin is warm. No rash noted. No erythema.  Psychiatric: Affect and judgment normal.   Left knee with anterior arthroplasty scar which is well-healed without any surrounding erythema, induration or pus.  Decreased ROM LEft leg   LABORATORY PANEL:   CBC  Recent Labs Lab 05/09/16 0443  WBC 7.3  HGB 9.5*  HCT 28.3*  PLT 221   ------------------------------------------------------------------------------------------------------------------  Chemistries   Recent Labs Lab 05/08/16 1432  05/09/16 0443  NA 132*  < > 131*  K 5.3*  < > 5.7*  CL 100*  < > 105  CO2 17*  < > 17*  GLUCOSE 95  < > 116*  BUN 81*  < > 74*  CREATININE 7.13*  < > 6.15*  CALCIUM 8.3*  < > 7.4*  MG  --   < > 2.1  AST 27  --   --   ALT 24  --   --   ALKPHOS 51  --   --   BILITOT 0.4  --   --   < > = values in this interval not displayed. ------------------------------------------------------------------------------------------------------------------  Cardiac Enzymes  Recent Labs Lab 05/08/16 1432 05/09/16 1050  TROPONINI <0.03 <0.03   ------------------------------------------------------------------------------------------------------------------  RADIOLOGY:  Dg Chest 1 View  Result Date: 05/08/2016 CLINICAL DATA:  Weakness, multiple falls.  Alert and oriented. EXAM: CHEST 1 VIEW COMPARISON:  None. FINDINGS: There is mild right basilar atelectasis. There is no focal parenchymal opacity. There is no pleural effusion or pneumothorax. The heart and mediastinal  contours are unremarkable. There is thoracic aortic atherosclerosis. The osseous structures are unremarkable. IMPRESSION: No active disease. Electronically Signed   By: Elige Ko   On: 05/08/2016 14:54   Dg Chest Port 1 View  Result Date: 05/09/2016 CLINICAL DATA:  Central line placement EXAM: PORTABLE CHEST 1 VIEW COMPARISON:  05/08/2016 FINDINGS: There is a new right jugular central line with tip in the expected location of the upper SVC. No pneumothorax. Stable curvilinear scarring or atelectasis in the right base. No confluent consolidation. Normal pulmonary vasculature. No large effusion. IMPRESSION: Right jugular central line reaches the upper SVC.  No pneumothorax. Electronically Signed   By: Ellery Plunk M.D.   On: 05/09/2016 00:44     ASSESSMENT AND PLAN:    71 year old female with recent left knee replacement, atrial fibrillation chronic kidney disease stage III who presented with hypotension after fall.  1 septic shock with severe hypotension and tachycardia on admission due to UTI Patient off pressors Follow-up on final blood cultures Wean hydrocortisone Continue Zosyn and vancomycin   2. Acute kidney injury in the setting of chronic kidney disease stage III with a baseline creatinine of 1.2 Acute kidney injury due to poor by mouth intake and use of nephrotoxic medications in addition to urinary tract infection Continue IV fluids Appreciate nephrology consultation  3. Hyperkalemia with metabolic acidosis Patient  on [place] daily as per nephrology  4. Syncope due to severe hypotension   Management plans discussed with the patient and she is in agreement.  CODE STATUS: full  TOTAL TIME TAKING CARE OF THIS PATIENT: 30 minutes.     POSSIBLE D/C 1-2 days, DEPENDING ON CLINICAL CONDITION.   Leniya Breit M.D on 05/09/2016 at 12:47 PM  Between 7am to 6pm - Pager - [phone] After 6pm go to [email or web address] - Social research officer, government  [facility] [place] [facility]   Office  [phone]  CC: Primary care physician; PROVIDER NOT IN SYSTEM  Note: This dictation was prepared with Dragon dictation along with smaller phrase technology. Any transcriptional errors that result from this process are unintentional.

## 2016-05-09 NOTE — Consult Note (Signed)
[facility] Critical Care Medicine Consultation    SYNOPSIS   71 yo female with CKD now presents with weakness, severe hypotension of uncertain etiology, AKI on CKD.   ASSESSMENT/PLAN     CARDIOVASCULAR A: Septic shock with hypotension.  Afib on eliquis.  Essential hypertension.  P:  --Continue pressors, IVF resuscitation.  --Continue stress dose steroids.  --Awaiting LE dopplers, given long car ride, and recent knee surgery. However is on long term eliquis, therefore PE seems less likely.  --No CT angio secondary to ARF.  --troponin x 1 negative; will trend.  --Echocardiogram pending.      RENAL A:  ARF on CKD with oliguria and hyperkalemia.  P:   --Has CKD, followed by nephro outpatient.  --Will consult nephrology.    INTAKE / OUTPUT:  Intake/Output Summary (Last 24 hours) at 05/09/16 0800 Last data filed at 05/09/16 0453  Gross per 24 hour  Intake             2000 ml  Output              425 ml  Net             1575 ml    GASTROINTESTINAL A:  -- P:   --  HEMATOLOGIC A:  -- P:  --  INFECTIOUS A:  ?Possible septic shock.  P:   Empiric abx.  Cultures.   Micro/culture results:  BCx2 --negative UC -- negative.  Sputum-- Procalcitonin; negative.   Antibiotics: Vancomycin 4/2>>  Zosyn 4/2>>  ENDOCRINE A:  Monitor glucose   P:   --  NEUROLOGIC A:  -- P:   RASS goal: -- --   MAJOR EVENTS/TEST RESULTS:   Best Practices  DVT Prophylaxis: on apixaban GI Prophylaxis: famotidine.   ---------------------------------------  ---------------------------------------   Name: Lindsay Mathews MRN: [id number] DOB: [birth year]    ADMISSION DATE:  05/08/2016   Subjective:  No new complaints today, feeling well.     REVIEW OF SYSTEMS:   Constitutional: Feels well. Cardiovascular: No chest pain.  Pulmonary: Denies dyspnea.   The remainder of systems were reviewed and were found to be negative other than what is documented in the  HPI.    VITAL SIGNS: Temp:  [97.5 F (36.4 C)-97.7 F (36.5 C)] 97.5 F (36.4 C) (04/02 1945) Pulse Rate:  [55-131] 62 (04/03 0600) Resp:  [10-18] 11 (04/03 0600) BP: (73-162)/(39-145) 88/50 (04/03 0600) SpO2:  [75 %-100 %] 94 % (04/03 0600) Weight:  [305 lb (138.3 kg)-310 lb 6.5 oz (140.8 kg)] 310 lb 6.5 oz (140.8 kg) (04/03 0454) HEMODYNAMICS:   VENTILATOR SETTINGS:   INTAKE / OUTPUT:  Intake/Output Summary (Last 24 hours) at 05/09/16 0800 Last data filed at 05/09/16 0453  Gross per 24 hour  Intake             2000 ml  Output              425 ml  Net             1575 ml    Physical Examination:   VS: BP (!) 88/50   Pulse 62   Temp 97.5 F (36.4 C) (Oral)   Resp 11   Ht  (1.702 m)   Wt (!) 310 lb 6.5 oz (140.8 kg)   SpO2 94%   BMI 48.62 kg/m   General Appearance: No distress  Neuro:without focal findings, mental status, speech normal,. HEENT: PERRLA, EOM intact, no ptosis, no other lesions noticed;  Pulmonary:  normal breath sounds., decreased air entry bilaterally  CardiovascularNormal S1,S2.  No m/r/g.    Abdomen: Benign, Soft, non-tender, No masses, hepatosplenomegaly,  Renal:  No costovertebral tenderness  GU:  Not performed at this time. Endoc: No evident thyromegaly, no signs of acromegaly. Skin:   warm, no rashes, no ecchymosis  Extremities: normal, no cyanosis, clubbing, no edema, warm with reduced capillary refill.    LABS: Reviewed   LABORATORY PANEL:   CBC  Recent Labs Lab 05/09/16 0443  WBC 7.3  HGB 9.5*  HCT 28.3*  PLT 221    Chemistries   Recent Labs Lab 05/08/16 1432  05/08/16 2301 05/09/16 0443  NA 132*  < >  --  131*  K 5.3*  < >  --  5.7*  CL 100*  < >  --  105  CO2 17*  < >  --  17*  GLUCOSE 95  < >  --  116*  BUN 81*  < >  --  74*  CREATININE 7.13*  < >  --  6.15*  CALCIUM 8.3*  < >  --  7.4*  MG  --   < > 2.2 2.1  PHOS  --   --  9.1*  --   AST 27  --   --   --   ALT 24  --   --   --   ALKPHOS 51  --   --    --   BILITOT 0.4  --   --   --   < > = values in this interval not displayed.   Recent Labs Lab 05/08/16 1851  GLUCAP 94   No results for input(s): PHART, PCO2ART, PO2ART in the last 168 hours.  Recent Labs Lab 05/08/16 1432  AST 27  ALT 24  ALKPHOS 51  BILITOT 0.4  ALBUMIN 3.2*    Cardiac Enzymes  Recent Labs Lab 05/08/16 1432  TROPONINI <0.03    RADIOLOGY:  Dg Chest 1 View  Result Date: 05/08/2016 CLINICAL DATA:  Weakness, multiple falls.  Alert and oriented. EXAM: CHEST 1 VIEW COMPARISON:  None. FINDINGS: There is mild right basilar atelectasis. There is no focal parenchymal opacity. There is no pleural effusion or pneumothorax. The heart and mediastinal contours are unremarkable. There is thoracic aortic atherosclerosis. The osseous structures are unremarkable. IMPRESSION: No active disease. Electronically Signed   By: Elige Ko   On: 05/08/2016 14:54   Dg Chest Port 1 View  Result Date: 05/09/2016 CLINICAL DATA:  Central line placement EXAM: PORTABLE CHEST 1 VIEW COMPARISON:  05/08/2016 FINDINGS: There is a new right jugular central line with tip in the expected location of the upper SVC. No pneumothorax. Stable curvilinear scarring or atelectasis in the right base. No confluent consolidation. Normal pulmonary vasculature. No large effusion. IMPRESSION: Right jugular central line reaches the upper SVC.  No pneumothorax. Electronically Signed   By: Ellery Plunk M.D.   On: 05/09/2016 00:44       --Wells Guiles, MD.  Board Certified in Internal Medicine, Pulmonary Medicine, Critical Care Medicine, and Sleep Medicine.  ICU Pager [phone] [place] [facility] Office Number: [phone]  Santiago Glad, M.D.  Billy Fischer, M.D   05/09/2016, 8:00 AM  Critical Care Attestation.  I have personally obtained a history, examined the patient, evaluated laboratory and imaging results, formulated the assessment and plan and placed orders. The  Patient requires high complexity decision making for assessment and support, frequent evaluation and titration of therapies, application  of advanced monitoring technologies and extensive interpretation of multiple databases. The patient has critical illness that could lead imminently to failure of 1 or more organ systems and requires the highest level of physician preparedness to intervene.  Critical Care Time devoted to patient care services described in this note is 35 minutes and is exclusive of time spent in procedures.

## 2016-05-09 NOTE — Progress Notes (Signed)
PT Cancellation Note  Patient Details Name: Lindsay Mathews MRN: [id number] DOB: 1945/11/29   Cancelled Treatment:    Reason Eval/Treat Not Completed: Medical issues which prohibited therapy; Pt's Ka 5.7 and trending up.  Current Ka level falls outside guidelines for participation with PT services.  Will attempt to evaluate patient at a future date/time as medically appropriate.    Ovidio Hanger PT, DPT 05/09/16, 2:10 PM

## 2016-05-09 NOTE — Progress Notes (Signed)
Chaplain received an OR for Prayer and responded to it immediately. CH met with the Pt. Pt's husband and daughter were bedside. Daughter stated Pt dehydrated because of not drinking enough water. Pt appeared to be doing much better today, she said. Pt told [place], she requested for prayers because she did not want her pastor to drive to hospital just to see her. CH encouraged the Pt and her family, offered prayers for healing, and a ministry of presence.   05/09/16 1100  Clinical Encounter Type  Visited With Patient;Patient and family together  Visit Type Initial;Spiritual support  Referral From Nurse  Consult/Referral To Chaplain  Spiritual Encounters  Spiritual Needs Prayer

## 2016-05-09 NOTE — Care Management Note (Signed)
Case Management Note  Patient Details  Name: Latarshia Jersey MRN: [id number] Date of Birth: 1945-10-20  Subjective/Objective:                  Spoke with patient's husband. Patient is currently being followed by HHPT which he states is "getting ready to end" post joint replacement. He does not recall agency name. At baseline, patient uses a walker to ambulate (2-wheeled or 4 wheeled walker). Husband states he will call this RNCM back with agency name.   Action/Plan: RNCM to continue to follow.   Expected Discharge Date:                  Expected Discharge Plan:     In-House Referral:     Discharge planning Services  CM Consult  Post Acute Care Choice:  Home Health Choice offered to:  Patient  DME Arranged:    DME Agency:     HH Arranged:    HH Agency:     Status of Service:  In process, will continue to follow  If discussed at Long Length of Stay Meetings, dates discussed:    Additional Comments:  Collie Siad, RN 05/09/2016, 2:23 PM

## 2016-05-09 NOTE — Procedures (Signed)
Central Venous Catheter Insertion Procedure Note Lindsay Mathews [id number] [birth year]  Procedure: Insertion of Central Venous Catheter Indications: Assessment of intravascular volume, Drug and/or fluid administration and Frequent blood sampling  Procedure Details Consent: Risks of procedure as well as the alternatives and risks of each were explained to the (patient/caregiver).  Consent for procedure obtained. Time Out: Verified patient identification, verified procedure, site/side was marked, verified correct patient position, special equipment/implants available, medications/allergies/relevent history reviewed, required imaging and test results available.  Performed  Maximum sterile technique was used including antiseptics, cap, gloves, gown, hand hygiene, mask and sheet. Skin prep: Chlorhexidine; local anesthetic administered A antimicrobial bonded/coated triple lumen catheter was placed in the right internal jugular vein using the Seldinger technique.  Evaluation Blood flow good Complications: No apparent complications Patient did tolerate procedure well. Chest X-ray ordered to verify placement.  CXR: normal.  Procedure performed under direct supervision of Dr Nicholos Johns. Ultrasound utilized for realtime vessel cannulation  Lindsay Mathews. Lindsay Mathews ANP-BC Pulmonary and Critical Care Medicine [facility] Lindsay Mathews Pager [phone] or [phone]  05/09/2016, 1:14 AM

## 2016-05-09 NOTE — Consult Note (Signed)
[facility] CONSULT NOTE    Date: 05/09/2016                  Patient Name:  Lindsay Mathews  MRN: [id number]  DOB: [birth year]  Age / Sex: 71 y.o., female         PCP: PROVIDER NOT IN SYSTEM                 Service Requesting Consult: Pulmonary/Critical Care                 Reason for Consult: Acute renal failure.            History of Present Illness: Patient is a 71 y.o. female with a PMHx of Left knee replacement, atrial fibrillation, hypertension, chronic kidney disease stage III Baseline creatinine 1.2, peripheral neuropathy who was admitted to [facility] on 05/08/2016 for evaluation of hypotension and fall. The patient was recently visiting [facility]. She was on her way back to [facility] and while she was at a rest stop she experienced a fall in the bathroom. She had finished using the bathroom and unfortunately fell to the floor. We are consulted now for evaluation management of acute renal failure. Upon presentation creatinine was 7. With gentle IV fluid hydration creatinine has come down to 6.15. She has been hypotensive while here.  She reports that her by mouth fluid intake was not very good when she was traveling. She was also noted to be on lisinopril/heart or thiazide. Back in November 2017 she had left knee replacement. At that time she developed an episode of acute renal failure. This did not require dialysis. With conservative management she states that her creatinine came back down to 1.2. However the last time she saw a nephrologist was in November. She was scheduled for follow-up but never did make this.  Patient denied taking any NSAIDs.   Medications: Outpatient medications: Prescriptions Prior to Admission  Medication Sig Dispense Refill Last Dose  . amitriptyline (ELAVIL) 10 MG tablet Take 20 mg by mouth at bedtime.   05/07/2016 at 2000  . apixaban (ELIQUIS) 5 MG TABS tablet Take 5 mg by mouth 2 (two) times daily.    05/08/2016 at 0800  . diltiazem (DILACOR XR) 180 MG 24 hr capsule Take 180 mg by mouth daily.   Unknown at Unknown  . gabapentin (NEURONTIN) 600 MG tablet Take 600 mg by mouth 3 (three) times daily.   Unknown at Unknown  . lisinopril-hydrochlorothiazide (PRINZIDE,ZESTORETIC) 20-25 MG tablet Take 1 tablet by mouth daily.   05/08/2016 at 0800  . metoprolol succinate (TOPROL-XL) 100 MG 24 hr tablet Take 100 mg by mouth daily. Take with or immediately following a meal.   Unknown at Unknown    Current medications: Current Facility-Administered Medications  Medication Dose Route Frequency Provider Last Rate Last Dose  . 0.9 %  sodium chloride infusion   Intravenous Continuous Mikael Spray, NP 75 mL/hr at 05/09/16 0800    . acetaminophen (TYLENOL) tablet 650 mg  650 mg Oral Q6H PRN Mikael Spray, NP   650 mg at 05/09/16 0150  . apixaban (ELIQUIS) tablet 5 mg  5 mg Oral BID Laverle Hobby, MD   5 mg at 05/08/16 2103  . famotidine (PEPCID) IVPB 20 mg premix  20 mg Intravenous Q12H Vipul Shah, MD   20 mg at 05/08/16 2213  . hydrocortisone sodium succinate (SOLU-CORTEF) 100 MG injection 50 mg  50 mg Intravenous Q8H Laverle Hobby,  MD   50 mg at 05/09/16 0138  . phenylephrine (NEO-SYNEPHRINE) 10 mg in sodium chloride 0.9 % 250 mL (0.04 mg/mL) infusion  0-400 mcg/min Intravenous Titrated Anders Simmonds, MD 30 mL/hr at 05/09/16 0800 20 mcg/min at 05/09/16 0800  . piperacillin-tazobactam (ZOSYN) 3.375 g in dextrose 5 % 50 mL IVPB  3.375 g Intravenous Q12H Max Sane, MD      . Derrill Memo ON 05/11/2016] vancomycin (VANCOCIN) 1,500 mg in sodium chloride 0.9 % 500 mL IVPB  1,500 mg Intravenous Q72H Vipul Shah, MD      . vancomycin (VANCOCIN) IVPB 1000 mg/200 mL premix  1,000 mg Intravenous Once Max Sane, MD          Allergies: No Known Allergies    Past Medical History: Past Medical History:  Diagnosis Date  . Hypertension      Past Surgical History: Past Surgical History:  Procedure  Laterality Date  . JOINT REPLACEMENT     left knee surgery     Family History: No family history on file.   Social History: Social History   Social History  . Marital status: Married    Spouse name: N/A  . Number of children: N/A  . Years of education: N/A   Occupational History  . Not on file.   Social History Main Topics  . Smoking status: Not on file  . Smokeless tobacco: Not on file  . Alcohol use Not on file  . Drug use: Unknown  . Sexual activity: Not on file   Other Topics Concern  . Not on file   Social History Narrative  . No narrative on file     Review of Systems: As per HPI  Vital Signs: Blood pressure (!) 88/50, pulse 62, temperature 97.5 F (36.4 C), temperature source Oral, resp. rate 11, height 5\' 7"  (1.702 m), weight (!) 140.8 kg (310 lb 6.5 oz), SpO2 94 %.  Weight trends: Filed Weights   05/08/16 1423 05/09/16 0454  Weight: (!) 138.3 kg (305 lb) (!) 140.8 kg (310 lb 6.5 oz)    Physical Exam: General: NAD, sitting up in bed  Head: Normocephalic, atraumatic.  Eyes: Anicteric, EOMI  Nose: Mucous membranes moist, not inflammed, nonerythematous.  Throat: Oropharynx nonerythematous, no exudate appreciated.   Neck: Supple, trachea midline.  Lungs:  Normal respiratory effort. Clear to auscultation BL without crackles or wheezes.  Heart: RRR. S1 and S2 normal without gallop, murmur, or rubs.  Abdomen:  BS normoactive. Soft, Nondistended, non-tender.  No masses or organomegaly.  Extremities: No pretibial edema.  Neurologic: A&O X3, Motor strength is 5/5 in the all 4 extremities  Skin: No visible rashes    Lab results: Basic Metabolic Panel:  Recent Labs Lab 05/08/16 1432 05/08/16 1941 05/08/16 2301 05/09/16 0443  NA 132* 129*  --  131*  K 5.3* 5.3*  --  5.7*  CL 100* 100*  --  105  CO2 17* 18*  --  17*  GLUCOSE 95 93  --  116*  BUN 81* 76*  --  74*  CREATININE 7.13* 6.70*  --  6.15*  CALCIUM 8.3* 7.9*  --  7.4*  MG  --   --   2.2 2.1  PHOS  --   --  9.1*  --     Liver Function Tests:  Recent Labs Lab 05/08/16 1432  AST 27  ALT 24  ALKPHOS 51  BILITOT 0.4  PROT 6.9  ALBUMIN 3.2*   No results for input(s): LIPASE, AMYLASE  in the last 168 hours. No results for input(s): AMMONIA in the last 168 hours.  CBC:  Recent Labs Lab 05/08/16 1432 05/09/16 0443  WBC 10.5 7.3  NEUTROABS 7.8*  --   HGB 9.9* 9.5*  HCT 29.6* 28.3*  MCV 90.5 90.3  PLT 254 221    Cardiac Enzymes:  Recent Labs Lab 05/08/16 1432  TROPONINI <0.03    BNP: Invalid input(s): POCBNP  CBG:  Recent Labs Lab 05/08/16 1851  GLUCAP 94    Microbiology: Results for orders placed or performed during the hospital encounter of 05/08/16  Culture, blood (routine x 2)     Status: None (Preliminary result)   Collection Time: 05/08/16  7:41 PM  Result Value Ref Range Status   Specimen Description BLOOD RIGHT [facility]  Final   Special Requests   Final    BOTTLES DRAWN AEROBIC AND ANAEROBIC Blood Culture adequate volume   Culture NO GROWTH < 12 HOURS  Final   Report Status PENDING  Incomplete  Culture, blood (routine x 2)     Status: None (Preliminary result)   Collection Time: 05/08/16  7:41 PM  Result Value Ref Range Status   Specimen Description BLOOD LEFT HAND  Final   Special Requests   Final    BOTTLES DRAWN AEROBIC AND ANAEROBIC Blood Culture adequate volume   Culture NO GROWTH < 12 HOURS  Final   Report Status PENDING  Incomplete    Coagulation Studies: No results for input(s): LABPROT, INR in the last 72 hours.  Urinalysis:  Recent Labs  05/09/16 0130  COLORURINE YELLOW*  LABSPEC 1.008  PHURINE 5.0  GLUCOSEU NEGATIVE  HGBUR MODERATE*  BILIRUBINUR NEGATIVE  KETONESUR NEGATIVE  PROTEINUR NEGATIVE  NITRITE NEGATIVE  LEUKOCYTESUR LARGE*      Imaging: Dg Chest 1 View  Result Date: 05/08/2016 CLINICAL DATA:  Weakness, multiple falls.  Alert and oriented. EXAM: CHEST 1 VIEW COMPARISON:  None. FINDINGS: There  is mild right basilar atelectasis. There is no focal parenchymal opacity. There is no pleural effusion or pneumothorax. The heart and mediastinal contours are unremarkable. There is thoracic aortic atherosclerosis. The osseous structures are unremarkable. IMPRESSION: No active disease. Electronically Signed   By: Kathreen Devoid   On: 05/08/2016 14:54   Dg Chest Port 1 View  Result Date: 05/09/2016 CLINICAL DATA:  Central line placement EXAM: PORTABLE CHEST 1 VIEW COMPARISON:  05/08/2016 FINDINGS: There is a new right jugular central line with tip in the expected location of the upper SVC. No pneumothorax. Stable curvilinear scarring or atelectasis in the right base. No confluent consolidation. Normal pulmonary vasculature. No large effusion. IMPRESSION: Right jugular central line reaches the upper SVC.  No pneumothorax. Electronically Signed   By: Andreas Newport M.D.   On: 05/09/2016 00:44      Assessment & Plan: Pt is a 71 y.o. female with a PMHx of Left knee replacement, atrial fibrillation, hypertension, chronic kidney disease stage III Baseline creatinine 1.2, peripheral neuropathy who was admitted to [facility] on 05/08/2016 for evaluation of hypotension and fall.   1.  Acute renal failure/CKD stage III baseline Cr 1.2.  Suspect acute renal failure now related to poor by mouth intake, use of lisinopril/Antivert thiazide, and urinary tract infection. Continue IV fluid hydration. Check SPEP, UPEP, ANA, ANCA antibodies, GBM antibodies, C3, C4, and renal ultrasound. No acute indication for dialysis at the moment however if renal function does not improve over the next several days we may need to consider this.  2. Hypotension.  This could be secondary to infection. Agree with broad-spectrum anabiotic's. Await blood cultures as well as urine culture.  3. Hyperkalemia. Serum potassium currently 5.7. Metabolic acidosis contributing. We will start the patient on patiromer 16.8g po daily.   4.  Thanks for  consultation.

## 2016-05-09 NOTE — Progress Notes (Signed)
Pt confused this shift with inappropriate responses, although she is very pleasant.  Family states this is new for her.  Dr Juliene Pina notified, pt likely with urosepsis, acknowledged.  Appetite very good, output excellent.  No word on her transfer to [facility].  Physical therapy ordered for inpatient

## 2016-05-09 NOTE — Progress Notes (Signed)
Pharmacy Antibiotic Note  Lindsay Mathews is a 71 y.o. female admitted on 05/08/2016 with sepsis.  Pharmacy has been consulted for Zosyn and vancomycin dosing.  Plan: 1. Continue Zosyn 3.375 gm IV Q12H EI 2. Vancomycin 1 gm IV x 1 in ED,additional 1gm 4/3 am for a total 2 gm load. Will continue vancomycin 1.5 gm IV Q72H, predicted trough 15 mcg/mL. May need to order vanc random level to assist with dosing.   Pharmacy will continue to follow and adjust as needed to maintain trough 15 to 20 mcg/mL.   Vd 64.4 L, Ke 0.013 hr-1, T1/2 52.8 hr  Height:  (170.2 cm) Weight: (!) 310 lb 6.5 oz (140.8 kg) IBW/kg (Calculated) : 61.6  Temp (24hrs), Avg:97.6 F (36.4 C), Min:97.5 F (36.4 C), Max:97.7 F (36.5 C)   Recent Labs Lab 05/08/16 1432 05/08/16 1941 05/09/16 0118 05/09/16 0443  WBC 10.5  --   --  7.3  CREATININE 7.13* 6.70*  --  6.15*  LATICACIDVEN  --  0.8 0.7  --     Estimated Creatinine Clearance: 12.4 mL/min (A) (by C-G formula based on SCr of 6.15 mg/dL (H)).    No Known Allergies  Thank you for allowing pharmacy to be a part of this patient's care.  Traivon Morrical M Honorio Devol, Pharm.D., BCPS Clinical Pharmacist 05/09/2016 12:12 PM

## 2016-05-09 NOTE — Care Management (Signed)
Received call from patient's daughter stating that patient is open to [facility] home health. I have notified [place] with [facility] and she will follow. Please resume home health orders at discharge.

## 2016-05-10 LAB — BASIC METABOLIC PANEL
ANION GAP: 7 (ref 5–15)
BUN: 72 mg/dL — ABNORMAL HIGH (ref 6–20)
CO2: 19 mmol/L — ABNORMAL LOW (ref 22–32)
Calcium: 8.1 mg/dL — ABNORMAL LOW (ref 8.9–10.3)
Chloride: 107 mmol/L (ref 101–111)
Creatinine, Ser: 4.55 mg/dL — ABNORMAL HIGH (ref 0.44–1.00)
GFR calc Af Amer: 10 mL/min — ABNORMAL LOW (ref >60)
GFR, EST NON AFRICAN AMERICAN: 9 mL/min — AB (ref >60)
Glucose, Bld: 133 mg/dL — ABNORMAL HIGH (ref 65–99)
POTASSIUM: 4.4 mmol/L (ref 3.5–5.1)
SODIUM: 133 mmol/L — AB (ref 135–145)

## 2016-05-10 LAB — MPO/PR-3 (ANCA) ANTIBODIES

## 2016-05-10 LAB — ANA W/REFLEX IF POSITIVE: ANA: NEGATIVE

## 2016-05-10 LAB — PROTEIN ELECTROPHORESIS, SERUM
A/G Ratio: 1 (ref 0.7–1.7)
ALPHA-1-GLOBULIN: 0.3 g/dL (ref 0.0–0.4)
ALPHA-2-GLOBULIN: 0.8 g/dL (ref 0.4–1.0)
Albumin ELP: 3.1 g/dL (ref 2.9–4.4)
Beta Globulin: 0.8 g/dL (ref 0.7–1.3)
GAMMA GLOBULIN: 1.3 g/dL (ref 0.4–1.8)
Globulin, Total: 3.2 g/dL (ref 2.2–3.9)
TOTAL PROTEIN ELP: 6.3 g/dL (ref 6.0–8.5)

## 2016-05-10 LAB — ECHOCARDIOGRAM COMPLETE
HEIGHTINCHES: 67 in
WEIGHTICAEL: 4966.52 [oz_av]

## 2016-05-10 LAB — C3 COMPLEMENT: C3 Complement: 141 mg/dL (ref 82–167)

## 2016-05-10 LAB — C4 COMPLEMENT: Complement C4, Body Fluid: 24 mg/dL (ref 14–44)

## 2016-05-10 LAB — CBC
HCT: 28.1 % — ABNORMAL LOW (ref 35.0–47.0)
HEMOGLOBIN: 9.5 g/dL — AB (ref 12.0–16.0)
MCH: 30.2 pg (ref 26.0–34.0)
MCHC: 34 g/dL (ref 32.0–36.0)
MCV: 88.9 fL (ref 80.0–100.0)
PLATELETS: 226 10*3/uL (ref 150–440)
RBC: 3.16 MIL/uL — ABNORMAL LOW (ref 3.80–5.20)
RDW: 14 % (ref 11.5–14.5)
WBC: 6.6 10*3/uL (ref 3.6–11.0)

## 2016-05-10 LAB — GLOMERULAR BASEMENT MEMBRANE ANTIBODIES: GBM Ab: 3 units (ref 0–20)

## 2016-05-10 LAB — PROCALCITONIN: Procalcitonin: <0.1 ng/mL

## 2016-05-10 LAB — MAGNESIUM: MAGNESIUM: 2 mg/dL (ref 1.7–2.4)

## 2016-05-10 MED ORDER — HYDROCORTISONE NA SUCCINATE PF 100 MG IJ SOLR
25.0000 mg | Freq: Three times a day (TID) | INTRAMUSCULAR | Status: DC
  Administered 2016-05-10 – 2016-05-11 (×2): 25 mg via INTRAVENOUS
  Filled 2016-05-10 (×2): qty 2

## 2016-05-10 MED ORDER — OXYCODONE HCL 5 MG PO TABS
5.0000 mg | ORAL_TABLET | ORAL | Status: DC | PRN
  Administered 2016-05-10 – 2016-05-12 (×10): 5 mg via ORAL
  Filled 2016-05-10 (×11): qty 1

## 2016-05-10 MED ORDER — FAMOTIDINE 20 MG PO TABS
20.0000 mg | ORAL_TABLET | Freq: Every day | ORAL | Status: DC
  Administered 2016-05-10 – 2016-05-11 (×2): 20 mg via ORAL
  Filled 2016-05-10 (×2): qty 1

## 2016-05-10 MED ORDER — MORPHINE SULFATE (PF) 4 MG/ML IV SOLN
INTRAVENOUS | Status: AC
  Filled 2016-05-10: qty 1

## 2016-05-10 MED ORDER — MORPHINE SULFATE (PF) 4 MG/ML IV SOLN
2.0000 mg | INTRAVENOUS | Status: AC
  Administered 2016-05-10: 2 mg via INTRAVENOUS

## 2016-05-10 NOTE — Progress Notes (Signed)
  Patient is stable currently.  Off pressors. Does not require ICU bed at this time.  Will Transfer the patient to the floor.  Case was discussed with Dr. Claudette Stapler.   Bincy Varughese,AG-ACNP Pulmonary & Critical Care

## 2016-05-10 NOTE — Progress Notes (Signed)
PT Cancellation Note  Patient Details Name: Lindsay Mathews MRN: [id number] DOB: 04-02-1945   Cancelled Treatment:    Reason Eval/Treat Not Completed: Other (comment);Fatigue/lethargy limiting ability to participate (Pt tired and not feeling well, asked to do eval in the AM)   Ivar Drape 05/10/2016, 2:37 PM   Samul Dada, PT MS Acute Rehab Dept. Number: [facility] [id number] and [facility] [phone]

## 2016-05-10 NOTE — Progress Notes (Signed)
[employer] - [place] at [facility]   PATIENT NAME: Lindsay Mathews    MR#:  [id number]  DATE OF BIRTH:  1945-11-07  SUBJECTIVE:  BP improved, off neo, no new complaints, sleepy REVIEW OF SYSTEMS:    Review of Systems  Constitutional: Negative.  Negative for chills, fever and malaise/fatigue.  HENT: Negative.  Negative for ear discharge, ear pain, hearing loss, nosebleeds and sore throat.   Eyes: Negative.  Negative for blurred vision and pain.  Respiratory: Negative.  Negative for cough, hemoptysis, shortness of breath and wheezing.   Cardiovascular: Negative.  Negative for chest pain, palpitations and leg swelling.  Gastrointestinal: Negative.  Negative for abdominal pain, blood in stool, diarrhea, nausea and vomiting.  Genitourinary: Negative.  Negative for dysuria.  Musculoskeletal: Negative.  Negative for back pain.  Skin: Negative.   Neurological: Negative for dizziness, tremors, speech change, focal weakness, seizures and headaches.  Endo/Heme/Allergies: Negative.  Does not bruise/bleed easily.  Psychiatric/Behavioral: Negative.  Negative for depression, hallucinations and suicidal ideas.    Tolerating Diet: yes DRUG ALLERGIES:  No Known Allergies  VITALS:  Blood pressure (!) 100/47, pulse 65, temperature 97.9 F (36.6 C), temperature source Oral, resp. rate 14, height  (1.702 m), weight (!) 142.5 kg (314 lb 2.5 oz), SpO2 97 %.  PHYSICAL EXAMINATION:   Physical Exam  Constitutional: She is oriented to person, place, and time and well-developed, well-nourished, and in no distress. No distress.  HENT:  Head: Normocephalic.  Eyes: No scleral icterus.  Neck: Normal range of motion. Neck supple. No JVD present. No tracheal deviation present.  Cardiovascular: Normal rate, regular rhythm and normal heart sounds.  Exam reveals no gallop and no friction rub.   No murmur heard. Pulmonary/Chest: Effort normal and breath sounds normal. No respiratory  distress. She has no wheezes. She has no rales. She exhibits no tenderness.  Abdominal: Soft. Bowel sounds are normal. She exhibits no distension and no mass. There is no tenderness. There is no rebound and no guarding.  Musculoskeletal: Normal range of motion. She exhibits no edema.  Neurological: She is alert and oriented to person, place, and time.  Skin: Skin is warm. No rash noted. No erythema.  Psychiatric: Affect and judgment normal.   Left knee with anterior arthroplasty scar which is well-healed without any surrounding erythema, induration or pus.  Decreased ROM LEft leg   LABORATORY PANEL:   CBC  Recent Labs Lab 05/10/16 0441  WBC 6.6  HGB 9.5*  HCT 28.1*  PLT 226   ------------------------------------------------------------------------------------------------------------------  Chemistries   Recent Labs Lab 05/08/16 1432  05/10/16 0441  NA 132*  < > 133*  K 5.3*  < > 4.4  CL 100*  < > 107  CO2 17*  < > 19*  GLUCOSE 95  < > 133*  BUN 81*  < > 72*  CREATININE 7.13*  < > 4.55*  CALCIUM 8.3*  < > 8.1*  MG  --   < > 2.0  AST 27  --   --   ALT 24  --   --   ALKPHOS 51  --   --   BILITOT 0.4  --   --   < > = values in this interval not displayed. ------------------------------------------------------------------------------------------------------------------  Cardiac Enzymes  Recent Labs Lab 05/08/16 1432 05/09/16 1050 05/09/16 2017  TROPONINI <0.03 <0.03 <0.03   ------------------------------------------------------------------------------------------------------------------  RADIOLOGY:  US Renal  Result Date: 05/09/2016 CLINICAL DATA:  Acute renal failure. EXAM: RENAL / URINARY TRACT  ULTRASOUND COMPLETE COMPARISON:  None. FINDINGS: Right Kidney: Length: 10.8 cm. Mild renal cortical thinning and prominent renal sinus fat. No hydronephrosis or focal cortical lesion. Left Kidney: Length: 10.7 cm. Mild renal cortical thinning and prominent renal sinus  fat. No hydronephrosis or focal cortical lesion. Bladder: Decompressed by Foley catheter.  No apparent abnormality. IMPRESSION: 1. No hydronephrosis. The bladder is decompressed by the Foley catheter. 2. Both kidneys are normal in size, although demonstrate mild cortical thinning and prominent sinus fat. Electronically Signed   By: Carey Bullocks M.D.   On: 05/09/2016 17:09   US Venous Img Lower Bilateral  Result Date: 05/09/2016 CLINICAL DATA:  Bilateral lower extremity pain EXAM: BILATERAL LOWER EXTREMITY VENOUS DUPLEX ULTRASOUND TECHNIQUE: Gray-scale sonography with graded compression, as well as color Doppler and duplex ultrasound were performed to evaluate the lower extremity deep venous systems from the level of the common femoral vein and including the common femoral, femoral, profunda femoral, popliteal and calf veins including the posterior tibial, peroneal and gastrocnemius veins when visible. The superficial great saphenous vein was also interrogated. Spectral Doppler was utilized to evaluate flow at rest and with distal augmentation maneuvers in the common femoral, femoral and popliteal veins. COMPARISON:  None. FINDINGS: RIGHT LOWER EXTREMITY Common Femoral Vein: No evidence of thrombus. Normal compressibility, respiratory phasicity and response to augmentation. Saphenofemoral Junction: No evidence of thrombus. Normal compressibility and flow on color Doppler imaging. Profunda Femoral Vein: No evidence of thrombus. Normal compressibility and flow on color Doppler imaging. Femoral Vein: No evidence of thrombus. Normal compressibility, respiratory phasicity and response to augmentation. Popliteal Vein: No evidence of thrombus. Normal compressibility, respiratory phasicity and response to augmentation. Calf Veins: No evidence of thrombus. Normal compressibility and flow on color Doppler imaging. Superficial Great Saphenous Vein: No evidence of thrombus. Normal compressibility and flow on color Doppler  imaging. Venous Reflux:  None. Other Findings:  None. LEFT LOWER EXTREMITY Common Femoral Vein: No evidence of thrombus. Normal compressibility, respiratory phasicity and response to augmentation. Saphenofemoral Junction: No evidence of thrombus. Normal compressibility and flow on color Doppler imaging. Profunda Femoral Vein: No evidence of thrombus. Normal compressibility and flow on color Doppler imaging. Femoral Vein: No evidence of thrombus. Normal compressibility, respiratory phasicity and response to augmentation. Popliteal Vein: No evidence of thrombus. Normal compressibility, respiratory phasicity and response to augmentation. Calf Veins: No evidence of thrombus. Normal compressibility and flow on color Doppler imaging. Superficial Great Saphenous Vein: No evidence of thrombus. Normal compressibility and flow on color Doppler imaging. Venous Reflux:  None. Other Findings:  None. IMPRESSION: No evidence of deep venous thrombosis in either lower extremity. Electronically Signed   By: Bretta Bang III M.D.   On: 05/09/2016 17:13   Dg Chest Port 1 View  Result Date: 05/09/2016 CLINICAL DATA:  Central line placement EXAM: PORTABLE CHEST 1 VIEW COMPARISON:  05/08/2016 FINDINGS: There is a new right jugular central line with tip in the expected location of the upper SVC. No pneumothorax. Stable curvilinear scarring or atelectasis in the right base. No confluent consolidation. Normal pulmonary vasculature. No large effusion. IMPRESSION: Right jugular central line reaches the upper SVC.  No pneumothorax. Electronically Signed   By: Ellery Plunk M.D.   On: 05/09/2016 00:44     ASSESSMENT AND PLAN:  71 year old female with recent left knee replacement, atrial fibrillation chronic kidney disease stage III who presented with hypotension after fall.  1 septic shock with severe hypotension and tachycardia on admission due to UTI Patient off pressors blood  cultures neg Continue Zosyn and stop  vancomycin  2. Acute kidney injury in the setting of chronic kidney disease stage III with a baseline creatinine of 1.2 Acute kidney injury due to poor by mouth intake and use of nephrotoxic medications in addition to urinary tract infection Continue IV fluids Creat 4.5  3. Hyperkalemia with metabolic acidosis Patient on Patrimore daily as per nephrology  4. Syncope due to severe hypotension   Management plans discussed with the patient, husband and they r in agreement.  Transfer to floor, await PT eval   CODE STATUS: full  TOTAL TIME TAKING CARE OF THIS PATIENT: 30 minutes.     POSSIBLE D/C 1-2 days, DEPENDING ON CLINICAL CONDITION.   Delfino Lovett M.D on 05/10/2016 at 6:24 PM  Between 7am to 6pm - Pager - [phone] After 6pm go to [email or web address] - Social research officer, government  [employer]  [facility]  Office  [phone]  CC: Primary care physician; PROVIDER NOT IN SYSTEM  Note: This dictation was prepared with Dragon dictation along with smaller phrase technology. Any transcriptional errors that result from this process are unintentional.

## 2016-05-10 NOTE — Progress Notes (Addendum)
[facility] Care Medicine Consultation    SYNOPSIS   71 yo female with CKD now presents with weakness, severe hypotension with E coli UTI, AKI on CKD.   ASSESSMENT/PLAN   Addendum: Family asked about transfer to hospital near her home. At this time I explained that she is likely only 2 or 3 days from being discharged. Transfer at this time will involve a team of doctors and consultants that will need to see her and re-evaluate her. Therefore I recommended that she remain here until discharge, they were satisfied with this and will keep her here.   CARDIOVASCULAR A: Septic shock with hypotension-- resolving, now off pressors.  Afib on eliquis.  Essential hypertension.  P:  --Wean down stress dose steroids.  --Negative LE dopplers. --troponin negative. --Echocardiogram Results reviewed, EF 60-65%. -Pulmonary hypertension noted on most recent echocardiogram, 05/09/16. This is likely secondary to volume overload, obesity.     RENAL A:  ARF on CKD with oliguria and hyperkalemia.  P:   --Has CKD, followed by nephro outpatient.  --d/w nephro, no HD needed at this time.    INTAKE / OUTPUT:  Intake/Output Summary (Last 24 hours) at 05/10/16 1008 Last data filed at 05/10/16 0900  Gross per 24 hour  Intake             2217 ml  Output             2550 ml  Net             -333 ml    GASTROINTESTINAL A:  -- P:   --  HEMATOLOGIC A:  -- P:  --  INFECTIOUS A:  UTI Possible septic shock.  P:   Empiric abx-- will wean down.  Cultures.   Micro/culture results:  BCx2 --negative UC -- E coli in urine.  Sputum-- Procalcitonin; negative.   Antibiotics: Vancomycin 4/2>>  Zosyn 4/2>>  ENDOCRINE A:  Monitor glucose   P:   --  NEUROLOGIC A:  -- P:   RASS goal: -- --   MAJOR EVENTS/TEST RESULTS:   Best Practices  DVT Prophylaxis: on apixaban GI Prophylaxis: famotidine.    ---------------------------------------  ---------------------------------------   Name: Lindsay Mathews MRN: [id number] DOB: 1945/10/23    ADMISSION DATE:  05/08/2016   Subjective:  No new complaints today, feeling well.     REVIEW OF SYSTEMS:   Constitutional: Feels well. Cardiovascular: No chest pain.  Pulmonary: Denies dyspnea.   The remainder of systems were reviewed and were found to be negative other than what is documented in the HPI.    VITAL SIGNS: Temp:  [97.5 F (36.4 C)-97.8 F (36.6 C)] 97.8 F (36.6 C) (04/04 0743) Pulse Rate:  [59-74] 65 (04/04 0600) Resp:  [11-21] 14 (04/04 0600) BP: (84-149)/(47-105) 100/47 (04/04 0743) SpO2:  [85 %-100 %] 97 % (04/04 0743) Weight:  [314 lb 2.5 oz (142.5 kg)] 314 lb 2.5 oz (142.5 kg) (04/04 0424) HEMODYNAMICS:   VENTILATOR SETTINGS:   INTAKE / OUTPUT:  Intake/Output Summary (Last 24 hours) at 05/10/16 1008 Last data filed at 05/10/16 0900  Gross per 24 hour  Intake             2217 ml  Output             2550 ml  Net             -333 ml    Physical Examination:   VS: BP (!) 100/47 (BP Location: Right Wrist)   Pulse  65   Temp 97.8 F (36.6 C) (Oral)   Resp 14   Ht  (1.702 m)   Wt (!) 314 lb 2.5 oz (142.5 kg)   SpO2 97%   BMI 49.20 kg/m   General Appearance: No distress  Neuro:without focal findings, mental status, speech normal,. HEENT: PERRLA, EOM intact, no ptosis, no other lesions noticed;  Pulmonary: normal breath sounds., decreased air entry bilaterally  CardiovascularNormal S1,S2.  No m/r/g.    Abdomen: Benign, Soft, non-tender, No masses, hepatosplenomegaly,  Renal:  No costovertebral tenderness  GU:  Not performed at this time. Endoc: No evident thyromegaly, no signs of acromegaly. Skin:   warm, no rashes, no ecchymosis  Extremities: normal, no cyanosis, clubbing, no edema, warm with reduced capillary refill.    LABS: Reviewed   LABORATORY PANEL:   CBC  Recent Labs Lab  05/10/16 0441  WBC 6.6  HGB 9.5*  HCT 28.1*  PLT 226    Chemistries   Recent Labs Lab 05/08/16 1432  05/08/16 2301  05/10/16 0441  NA 132*  < >  --   < > 133*  K 5.3*  < >  --   < > 4.4  CL 100*  < >  --   < > 107  CO2 17*  < >  --   < > 19*  GLUCOSE 95  < >  --   < > 133*  BUN 81*  < >  --   < > 72*  CREATININE 7.13*  < >  --   < > 4.55*  CALCIUM 8.3*  < >  --   < > 8.1*  MG  --   --  2.2  < > 2.0  PHOS  --   --  9.1*  --   --   AST 27  --   --   --   --   ALT 24  --   --   --   --   ALKPHOS 51  --   --   --   --   BILITOT 0.4  --   --   --   --   < > = values in this interval not displayed.   Recent Labs Lab 05/08/16 1851  GLUCAP 94   No results for input(s): PHART, PCO2ART, PO2ART in the last 168 hours.  Recent Labs Lab 05/08/16 1432  AST 27  ALT 24  ALKPHOS 51  BILITOT 0.4  ALBUMIN 3.2*    Cardiac Enzymes  Recent Labs Lab 05/09/16 2017  TROPONINI <0.03    RADIOLOGY:  Dg Chest 1 View  Result Date: 05/08/2016 CLINICAL DATA:  Weakness, multiple falls.  Alert and oriented. EXAM: CHEST 1 VIEW COMPARISON:  None. FINDINGS: There is mild right basilar atelectasis. There is no focal parenchymal opacity. There is no pleural effusion or pneumothorax. The heart and mediastinal contours are unremarkable. There is thoracic aortic atherosclerosis. The osseous structures are unremarkable. IMPRESSION: No active disease. Electronically Signed   By: Elige Ko   On: 05/08/2016 14:54   US Renal  Result Date: 05/09/2016 CLINICAL DATA:  Acute renal failure. EXAM: RENAL / URINARY TRACT ULTRASOUND COMPLETE COMPARISON:  None. FINDINGS: Right Kidney: Length: 10.8 cm. Mild renal cortical thinning and prominent renal sinus fat. No hydronephrosis or focal cortical lesion. Left Kidney: Length: 10.7 cm. Mild renal cortical thinning and prominent renal sinus fat. No hydronephrosis or focal cortical lesion. Bladder: Decompressed by Foley catheter.  No apparent abnormality.  IMPRESSION:  1. No hydronephrosis. The bladder is decompressed by the Foley catheter. 2. Both kidneys are normal in size, although demonstrate mild cortical thinning and prominent sinus fat. Electronically Signed   By: Carey Bullocks M.D.   On: 05/09/2016 17:09   US Venous Img Lower Bilateral  Result Date: 05/09/2016 CLINICAL DATA:  Bilateral lower extremity pain EXAM: BILATERAL LOWER EXTREMITY VENOUS DUPLEX ULTRASOUND TECHNIQUE: Gray-scale sonography with graded compression, as well as color Doppler and duplex ultrasound were performed to evaluate the lower extremity deep venous systems from the level of the common femoral vein and including the common femoral, femoral, profunda femoral, popliteal and calf veins including the posterior tibial, peroneal and gastrocnemius veins when visible. The superficial great saphenous vein was also interrogated. Spectral Doppler was utilized to evaluate flow at rest and with distal augmentation maneuvers in the common femoral, femoral and popliteal veins. COMPARISON:  None. FINDINGS: RIGHT LOWER EXTREMITY Common Femoral Vein: No evidence of thrombus. Normal compressibility, respiratory phasicity and response to augmentation. Saphenofemoral Junction: No evidence of thrombus. Normal compressibility and flow on color Doppler imaging. Profunda Femoral Vein: No evidence of thrombus. Normal compressibility and flow on color Doppler imaging. Femoral Vein: No evidence of thrombus. Normal compressibility, respiratory phasicity and response to augmentation. Popliteal Vein: No evidence of thrombus. Normal compressibility, respiratory phasicity and response to augmentation. Calf Veins: No evidence of thrombus. Normal compressibility and flow on color Doppler imaging. Superficial Great Saphenous Vein: No evidence of thrombus. Normal compressibility and flow on color Doppler imaging. Venous Reflux:  None. Other Findings:  None. LEFT LOWER EXTREMITY Common Femoral Vein: No evidence of  thrombus. Normal compressibility, respiratory phasicity and response to augmentation. Saphenofemoral Junction: No evidence of thrombus. Normal compressibility and flow on color Doppler imaging. Profunda Femoral Vein: No evidence of thrombus. Normal compressibility and flow on color Doppler imaging. Femoral Vein: No evidence of thrombus. Normal compressibility, respiratory phasicity and response to augmentation. Popliteal Vein: No evidence of thrombus. Normal compressibility, respiratory phasicity and response to augmentation. Calf Veins: No evidence of thrombus. Normal compressibility and flow on color Doppler imaging. Superficial Great Saphenous Vein: No evidence of thrombus. Normal compressibility and flow on color Doppler imaging. Venous Reflux:  None. Other Findings:  None. IMPRESSION: No evidence of deep venous thrombosis in either lower extremity. Electronically Signed   By: Bretta Bang III M.D.   On: 05/09/2016 17:13   Dg Chest Port 1 View  Result Date: 05/09/2016 CLINICAL DATA:  Central line placement EXAM: PORTABLE CHEST 1 VIEW COMPARISON:  05/08/2016 FINDINGS: There is a new right jugular central line with tip in the expected location of the upper SVC. No pneumothorax. Stable curvilinear scarring or atelectasis in the right base. No confluent consolidation. Normal pulmonary vasculature. No large effusion. IMPRESSION: Right jugular central line reaches the upper SVC.  No pneumothorax. Electronically Signed   By: Ellery Plunk M.D.   On: 05/09/2016 00:44       --Wells Guiles, MD.  Board Certified in Internal Medicine, Pulmonary Medicine, Critical Care Medicine, and Sleep Medicine.  ICU Pager [phone] [facility] Office Number: [phone]  Santiago Glad, M.D.  Billy Fischer, M.D   05/10/2016, 10:08 AM

## 2016-05-10 NOTE — Progress Notes (Signed)
Report given to Josj RN on 2c, family at bedside notified about the transfer. VS wnl at time of transfer as charted.

## 2016-05-10 NOTE — Progress Notes (Signed)
[facility]  ROUNDING NOTE   Subjective:  Patient reports having significant back pain this a.m. She has received morphine. Renal function has significantly improved and creatinine is down to 4.5. Renal ultrasound was negative for hydronephrosis. Good urine output noted.   Objective:  Vital signs in last 24 hours:  Temp:  [97.5 F (36.4 C)-97.8 F (36.6 C)] 97.8 F (36.6 C) (04/04 0743) Pulse Rate:  [59-74] 65 (04/04 0600) Resp:  [11-21] 14 (04/04 0600) BP: (84-149)/(47-105) 100/47 (04/04 0743) SpO2:  [85 %-100 %] 97 % (04/04 0743) Weight:  [142.5 kg (314 lb 2.5 oz)] 142.5 kg (314 lb 2.5 oz) (04/04 0424)  Weight change: 4.153 kg (9 lb 2.5 oz) Filed Weights   05/08/16 1423 05/09/16 0454 05/10/16 0424  Weight: (!) 138.3 kg (305 lb) (!) 140.8 kg (310 lb 6.5 oz) (!) 142.5 kg (314 lb 2.5 oz)    Intake/Output: I/O last 3 completed shifts: In: 4756.2 [P.O.:594; I.V.:3687.2; Other:75; IV Piggyback:400] Out: 3100 [Urine:3100]   Intake/Output this shift:  No intake/output data recorded.  Physical Exam: General: No acute distress  Head: Normocephalic, atraumatic. Moist oral mucosal membranes  Eyes: Anicteric  Neck: Supple, trachea midline  Lungs:  Clear to auscultation, normal effort  Heart: S1S2 no rubs  Abdomen:  Soft, nontender  Extremities: Trace peripheral edema.  Neurologic: Nonfocal, moving all four extremities  Skin: No lesions       Basic Metabolic Panel:  Recent Labs Lab 05/08/16 1432 05/08/16 1941 05/08/16 2301 05/09/16 0443 05/10/16 0441  NA 132* 129*  --  131* 133*  K 5.3* 5.3*  --  5.7* 4.4  CL 100* 100*  --  105 107  CO2 17* 18*  --  17* 19*  GLUCOSE 95 93  --  116* 133*  BUN 81* 76*  --  74* 72*  CREATININE 7.13* 6.70*  --  6.15* 4.55*  CALCIUM 8.3* 7.9*  --  7.4* 8.1*  MG  --   --  2.2 2.1 2.0  PHOS  --   --  9.1*  --   --     Liver Function Tests:  Recent Labs Lab 05/08/16 1432  AST 27  ALT 24  ALKPHOS 51  BILITOT  0.4  PROT 6.9  ALBUMIN 3.2*   No results for input(s): LIPASE, AMYLASE in the last 168 hours. No results for input(s): AMMONIA in the last 168 hours.  CBC:  Recent Labs Lab 05/08/16 1432 05/09/16 0443 05/10/16 0441  WBC 10.5 7.3 6.6  NEUTROABS 7.8*  --   --   HGB 9.9* 9.5* 9.5*  HCT 29.6* 28.3* 28.1*  MCV 90.5 90.3 88.9  PLT 254 221 226    Cardiac Enzymes:  Recent Labs Lab 05/08/16 1432 05/09/16 1050 05/09/16 2017  TROPONINI <0.03 <0.03 <0.03    BNP: Invalid input(s): POCBNP  CBG:  Recent Labs Lab 05/08/16 1851  GLUCAP 94    Microbiology: Results for orders placed or performed during the hospital encounter of 05/08/16  Culture, blood (routine x 2)     Status: None (Preliminary result)   Collection Time: 05/08/16  7:41 PM  Result Value Ref Range Status   Specimen Description BLOOD RIGHT [facility]  Final   Special Requests   Final    BOTTLES DRAWN AEROBIC AND ANAEROBIC Blood Culture adequate volume   Culture NO GROWTH 2 DAYS  Final   Report Status PENDING  Incomplete  Culture, blood (routine x 2)     Status: None (Preliminary result)  Collection Time: 05/08/16  7:41 PM  Result Value Ref Range Status   Specimen Description BLOOD LEFT HAND  Final   Special Requests   Final    BOTTLES DRAWN AEROBIC AND ANAEROBIC Blood Culture adequate volume   Culture NO GROWTH 2 DAYS  Final   Report Status PENDING  Incomplete  Urine culture     Status: Abnormal (Preliminary result)   Collection Time: 05/09/16  1:30 AM  Result Value Ref Range Status   Specimen Description URINE, CATHETERIZED  Final   Special Requests NONE  Final   Culture 50,000 COLONIES/mL GRAM NEGATIVE RODS (A)  Final   Report Status PENDING  Incomplete  MRSA PCR Screening     Status: None   Collection Time: 05/09/16 10:51 AM  Result Value Ref Range Status   MRSA by PCR NEGATIVE NEGATIVE Final    Comment:        The GeneXpert MRSA Assay (FDA approved for NASAL specimens only), is one component of  a comprehensive MRSA colonization surveillance program. It is not intended to diagnose MRSA infection nor to guide or monitor treatment for MRSA infections.     Coagulation Studies: No results for input(s): LABPROT, INR in the last 72 hours.  Urinalysis:  Recent Labs  05/09/16 0130  COLORURINE YELLOW*  LABSPEC 1.008  PHURINE 5.0  GLUCOSEU NEGATIVE  HGBUR MODERATE*  BILIRUBINUR NEGATIVE  KETONESUR NEGATIVE  PROTEINUR NEGATIVE  NITRITE NEGATIVE  LEUKOCYTESUR LARGE*      Imaging: Dg Chest 1 View  Result Date: 05/08/2016 CLINICAL DATA:  Weakness, multiple falls.  Alert and oriented. EXAM: CHEST 1 VIEW COMPARISON:  None. FINDINGS: There is mild right basilar atelectasis. There is no focal parenchymal opacity. There is no pleural effusion or pneumothorax. The heart and mediastinal contours are unremarkable. There is thoracic aortic atherosclerosis. The osseous structures are unremarkable. IMPRESSION: No active disease. Electronically Signed   By: Elige Ko   On: 05/08/2016 14:54   US Renal  Result Date: 05/09/2016 CLINICAL DATA:  Acute renal failure. EXAM: RENAL / URINARY TRACT ULTRASOUND COMPLETE COMPARISON:  None. FINDINGS: Right Kidney: Length: 10.8 cm. Mild renal cortical thinning and prominent renal sinus fat. No hydronephrosis or focal cortical lesion. Left Kidney: Length: 10.7 cm. Mild renal cortical thinning and prominent renal sinus fat. No hydronephrosis or focal cortical lesion. Bladder: Decompressed by Foley catheter.  No apparent abnormality. IMPRESSION: 1. No hydronephrosis. The bladder is decompressed by the Foley catheter. 2. Both kidneys are normal in size, although demonstrate mild cortical thinning and prominent sinus fat. Electronically Signed   By: Carey Bullocks M.D.   On: 05/09/2016 17:09   US Venous Img Lower Bilateral  Result Date: 05/09/2016 CLINICAL DATA:  Bilateral lower extremity pain EXAM: BILATERAL LOWER EXTREMITY VENOUS DUPLEX ULTRASOUND TECHNIQUE:  Gray-scale sonography with graded compression, as well as color Doppler and duplex ultrasound were performed to evaluate the lower extremity deep venous systems from the level of the common femoral vein and including the common femoral, femoral, profunda femoral, popliteal and calf veins including the posterior tibial, peroneal and gastrocnemius veins when visible. The superficial great saphenous vein was also interrogated. Spectral Doppler was utilized to evaluate flow at rest and with distal augmentation maneuvers in the common femoral, femoral and popliteal veins. COMPARISON:  None. FINDINGS: RIGHT LOWER EXTREMITY Common Femoral Vein: No evidence of thrombus. Normal compressibility, respiratory phasicity and response to augmentation. Saphenofemoral Junction: No evidence of thrombus. Normal compressibility and flow on color Doppler imaging. Profunda Femoral Vein: No  evidence of thrombus. Normal compressibility and flow on color Doppler imaging. Femoral Vein: No evidence of thrombus. Normal compressibility, respiratory phasicity and response to augmentation. Popliteal Vein: No evidence of thrombus. Normal compressibility, respiratory phasicity and response to augmentation. Calf Veins: No evidence of thrombus. Normal compressibility and flow on color Doppler imaging. Superficial Great Saphenous Vein: No evidence of thrombus. Normal compressibility and flow on color Doppler imaging. Venous Reflux:  None. Other Findings:  None. LEFT LOWER EXTREMITY Common Femoral Vein: No evidence of thrombus. Normal compressibility, respiratory phasicity and response to augmentation. Saphenofemoral Junction: No evidence of thrombus. Normal compressibility and flow on color Doppler imaging. Profunda Femoral Vein: No evidence of thrombus. Normal compressibility and flow on color Doppler imaging. Femoral Vein: No evidence of thrombus. Normal compressibility, respiratory phasicity and response to augmentation. Popliteal Vein: No evidence  of thrombus. Normal compressibility, respiratory phasicity and response to augmentation. Calf Veins: No evidence of thrombus. Normal compressibility and flow on color Doppler imaging. Superficial Great Saphenous Vein: No evidence of thrombus. Normal compressibility and flow on color Doppler imaging. Venous Reflux:  None. Other Findings:  None. IMPRESSION: No evidence of deep venous thrombosis in either lower extremity. Electronically Signed   By: Bretta Bang III M.D.   On: 05/09/2016 17:13   Dg Chest Port 1 View  Result Date: 05/09/2016 CLINICAL DATA:  Central line placement EXAM: PORTABLE CHEST 1 VIEW COMPARISON:  05/08/2016 FINDINGS: There is a new right jugular central line with tip in the expected location of the upper SVC. No pneumothorax. Stable curvilinear scarring or atelectasis in the right base. No confluent consolidation. Normal pulmonary vasculature. No large effusion. IMPRESSION: Right jugular central line reaches the upper SVC.  No pneumothorax. Electronically Signed   By: Ellery Plunk M.D.   On: 05/09/2016 00:44     Medications:   . sodium chloride 75 mL/hr at 05/09/16 1830  . phenylephrine (NEO-SYNEPHRINE) Adult infusion Stopped (05/09/16 1120)   . apixaban  5 mg Oral BID  . famotidine (PEPCID) IV  20 mg Intravenous Q24H  . hydrocortisone sod succinate (SOLU-CORTEF) inj  50 mg Intravenous Q8H  . patiromer  16.8 g Oral Daily  . piperacillin-tazobactam (ZOSYN) IVPB  3.375 g Intravenous Q12H  . [START ON 05/11/2016] vancomycin  1,500 mg Intravenous Q72H   acetaminophen, docusate sodium, oxyCODONE  Assessment/ Plan:  71 y.o. female with a PMHx of Left knee replacement, atrial fibrillation, hypertension, chronic kidney disease stage III Baseline creatinine 1.2, peripheral neuropathy who was admitted to [facility] on 05/08/2016 for evaluation of hypotension and fall.   1.  Acute renal failure/CKD stage III baseline Cr 1.2.  Suspect acute renal failure now related to poor by mouth  intake, use of lisinopril/HCTZ, and urinary tract infection.  - Renal function has improved significantly. Creatinine down to 4.55. Continue IV fluid hydration at this time.  2. Hypotension. Blood pressure currently 100/47. Continue hydrocortisone and IV fluid hydration.  3. Hyperkalemia.  Serum potassium down to 4.4. We will go ahead and discontinue paternal murmur at this time.   LOS: 2 Lindsay Mathews 4/4/20189:37 AM

## 2016-05-11 LAB — BASIC METABOLIC PANEL
ANION GAP: 8 (ref 5–15)
BUN: 56 mg/dL — ABNORMAL HIGH (ref 6–20)
CHLORIDE: 112 mmol/L — AB (ref 101–111)
CO2: 19 mmol/L — AB (ref 22–32)
CREATININE: 3.15 mg/dL — AB (ref 0.44–1.00)
Calcium: 8.7 mg/dL — ABNORMAL LOW (ref 8.9–10.3)
GFR calc non Af Amer: 14 mL/min — ABNORMAL LOW (ref >60)
GFR, EST AFRICAN AMERICAN: 16 mL/min — AB (ref >60)
Glucose, Bld: 113 mg/dL — ABNORMAL HIGH (ref 65–99)
POTASSIUM: 3.8 mmol/L (ref 3.5–5.1)
SODIUM: 139 mmol/L (ref 135–145)

## 2016-05-11 LAB — PROTEIN ELECTRO, RANDOM URINE
ALBUMIN ELP UR: 36.2 %
ALPHA-2-GLOBULIN, U: 5.7 %
Alpha-1-Globulin, U: 3.2 %
BETA GLOBULIN, U: 23.6 %
GAMMA GLOBULIN, U: 31.4 %
TOTAL PROTEIN, URINE-UPE24: 16.8 mg/dL

## 2016-05-11 LAB — CBC
HEMATOCRIT: 30.1 % — AB (ref 35.0–47.0)
Hemoglobin: 10.2 g/dL — ABNORMAL LOW (ref 12.0–16.0)
MCH: 30.1 pg (ref 26.0–34.0)
MCHC: 33.8 g/dL (ref 32.0–36.0)
MCV: 89 fL (ref 80.0–100.0)
Platelets: 278 10*3/uL (ref 150–440)
RBC: 3.38 MIL/uL — AB (ref 3.80–5.20)
RDW: 14.1 % (ref 11.5–14.5)
WBC: 9.8 10*3/uL (ref 3.6–11.0)

## 2016-05-11 LAB — URINE CULTURE: Culture: 50000 — AB

## 2016-05-11 MED ORDER — HYDROCHLOROTHIAZIDE 25 MG PO TABS: 25.0000 mg | ORAL_TABLET | Freq: Every day | ORAL | Status: DC

## 2016-05-11 MED ORDER — LISINOPRIL 20 MG PO TABS: 20.0000 mg | ORAL_TABLET | Freq: Every day | ORAL | Status: DC

## 2016-05-11 MED ORDER — LISINOPRIL-HYDROCHLOROTHIAZIDE 20-25 MG PO TABS: 1.0000 | ORAL_TABLET | Freq: Every day | ORAL | Status: DC

## 2016-05-11 MED ORDER — METOPROLOL SUCCINATE ER 100 MG PO TB24: 100.0000 mg | ORAL_TABLET | Freq: Every day | ORAL | Status: DC

## 2016-05-11 MED ORDER — SODIUM CHLORIDE 0.9 % IV SOLN
INTRAVENOUS | Status: DC
Start: 2016-05-11 — End: 2016-05-11

## 2016-05-11 MED ORDER — METOPROLOL SUCCINATE ER 100 MG PO TB24
100.0000 mg | ORAL_TABLET | Freq: Every day | ORAL | Status: DC
  Administered 2016-05-11 – 2016-05-12 (×2): 100 mg via ORAL
  Filled 2016-05-11 (×2): qty 1

## 2016-05-11 MED ORDER — DILTIAZEM HCL ER COATED BEADS 180 MG PO CP24
180.0000 mg | ORAL_CAPSULE | Freq: Every day | ORAL | Status: DC
  Administered 2016-05-11 – 2016-05-12 (×2): 180 mg via ORAL
  Filled 2016-05-11 (×2): qty 1

## 2016-05-11 NOTE — NC FL2 (Signed)
  [place] MEDICAID FL2 LEVEL OF CARE SCREENING TOOL     IDENTIFICATION  Patient Name: Lindsay Mathews Birthdate: 1945/05/09 Sex: female Admission Date (Current Location): 05/08/2016  [place] and [place] Number:  Chiropodist and Address:  [facility], [address], [place], [place] [address]      Provider Number: [id number]  Attending Physician Name and Address:  Delfino Lovett, MD  Relative Name and Phone Number:       Current Level of Care: Hospital Recommended Level of Care: Skilled Nursing Facility Prior Approval Number:    Date Approved/Denied:   PASRR Number: [id number]  Discharge Plan: SNF    Current Diagnoses: Patient Active Problem List   Diagnosis Date Noted  . Acute renal failure superimposed on chronic kidney disease (HCC)   . Hypotension 05/08/2016    Orientation RESPIRATION BLADDER Height & Weight     Self, Time, Situation, Place  Normal Continent Weight: (!) 314 lb 11.2 oz (142.7 kg) Height:   (170.2 cm)  BEHAVIORAL SYMPTOMS/MOOD NEUROLOGICAL BOWEL NUTRITION STATUS      Continent Diet (Renal Diet, Fluid Restriction:  1200 mL,)  AMBULATORY STATUS COMMUNICATION OF NEEDS Skin   Limited Assist Verbally Normal                       Personal Care Assistance Level of Assistance  Feeding, Dressing, Bathing Bathing Assistance: Limited assistance Feeding assistance: Independent Dressing Assistance: Limited assistance     Functional Limitations Info  Sight, Hearing, Speech Sight Info: Adequate Hearing Info: Adequate Speech Info: Adequate    SPECIAL CARE FACTORS FREQUENCY  PT (By licensed PT)                    Contractures      Additional Factors Info                  Current Medications (05/11/2016):  This is the current hospital active medication list Current Facility-Administered Medications  Medication Dose Route Frequency Provider Last Rate Last Dose  . acetaminophen (TYLENOL)  tablet 650 mg  650 mg Oral Q6H PRN Lewie Loron, NP   650 mg at 05/11/16 1301  . apixaban (ELIQUIS) tablet 5 mg  5 mg Oral BID Shane Crutch, MD   5 mg at 05/11/16 1045  . diltiazem (CARDIZEM CD) 24 hr capsule 180 mg  180 mg Oral Daily Arnaldo Natal, MD   180 mg at 05/11/16 1045  . docusate sodium (COLACE) capsule 100 mg  100 mg Oral BID PRN Bincy S Varughese, NP   100 mg at 05/11/16 1045  . famotidine (PEPCID) tablet 20 mg  20 mg Oral QHS Shane Crutch, MD   20 mg at 05/10/16 2224  . metoprolol succinate (TOPROL-XL) 24 hr tablet 100 mg  100 mg Oral Daily Arnaldo Natal, MD   100 mg at 05/11/16 0554  . oxyCODONE (Oxy IR/ROXICODONE) immediate release tablet 5 mg  5 mg Oral Q4H PRN Gwendolyn Fill, NP   5 mg at 05/11/16 5643     Discharge Medications: Please see discharge summary for a list of discharge medications.  Relevant Imaging Results:  Relevant Lab Results:   Additional Information    Dede Query, LCSW

## 2016-05-11 NOTE — Evaluation (Signed)
Occupational Therapy Evaluation Patient Details Name: Lindsay Mathews MRN: [id number] DOB: 1945/07/03 Today's Date: 05/11/2016    History of Present Illness Lindsay Mathews  is a 71 y.o. 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 She says that she was getting up to walk from her car in a rest stop bathroom when she felt weak in her knees and fell to the ground. She says that she hit a bar on the bathroom wall to her upper back and is now having aching pain to the upper back. Denies that she completely passed out. To the best of the memory of her daughter and the patient her last creatinine several months ago was about 2 and may have gone down to 1 while she was at rehab after knee surgery per her daughter.  The patient was on a trip from [place] back home to [place] when this event happened while at rest area. Had left knee replacement in November 17 and had to change knee cap in Jan'18 (?Sepsis/fall) and developed atrial fibrillation for which she is on eliquis. Septic shock with severe hypotension and tachycardia on admission due to UTI    Clinical Impression   Pt is  71 year old female who lives in [place] with her husband, daughter and her husband.  They live in a 2 story home.  She and her husband live upstairs with about 13 stairs from downstairs to upstairs.  Arrangements can be made by her family so she can have her recliner downstairs which she is in most of the day and sleeps in as well. She has grab bars and a shower chair at home with handicapped height toilet.  She presents with SOB at rest and with exertion and is able to complete feeding, grooming and UB dressing with supervision but needs mod assist for LB dressing and bathing due to pain in head 8/10, SOB and decreased stamina.  She has a Sports administrator at home since her knee surgery and was receiving HH PT and OT.  She is at risk for falls due  to SOB and decreased stamina and would benefit from SNF before returning home and having HH PT and OT again.  Pt would benefit from skilled OT services to increase independence in ADLs, education in energy conservation techniques, pursed lip breathing and recommendations for home modifications to increase safety and prevent falls.  Also recommend home exercise program continue to increase stamina for ADLs.        Follow Up Recommendations  SNF    Equipment Recommendations       Recommendations for Other Services       Precautions / Restrictions Precautions Precautions: Fall Restrictions Weight Bearing Restrictions: No      Mobility Bed Mobility                  Transfers                      Balance                                           ADL either performed or assessed with clinical judgement   ADL Overall ADL's : Needs assistance/impaired Eating/Feeding: Independent;Set up   Grooming: Wash/dry hands;Wash/dry face;Oral care;Brushing hair;Set up;Minimal assistance Grooming Details (indicate cue type and reason):  min assist due to SOB and decreased stamina         Upper Body Dressing : Independent;Set up   Lower Body Dressing: Set up;Moderate assistance Lower Body Dressing Details (indicate cue type and reason): Pt limited in ability to lean forward to reach feet while in recliner due to pain in head, SOB and poor stamina for activities                     Vision Patient Visual Report: No change from baseline       Perception     Praxis      Pertinent Vitals/Pain Pain Assessment: 0-10 Pain Score: 8  Pain Location: head in front right side Pain Descriptors / Indicators: Constant;Discomfort;Pressure Pain Intervention(s): Limited activity within patient's tolerance;Monitored during session;Premedicated before session     Hand Dominance Right   Extremity/Trunk Assessment Upper Extremity Assessment Upper Extremity  Assessment: Generalized weakness   Lower Extremity Assessment Lower Extremity Assessment: Defer to PT evaluation       Communication Communication Communication: No difficulties   Cognition Arousal/Alertness: Awake/alert Behavior During Therapy: WFL for tasks assessed/performed Overall Cognitive Status: Within Functional Limits for tasks assessed                                 General Comments: Pt very SOB at rest with minimal endurance   General Comments       Exercises     Shoulder Instructions      Home Living Family/patient expects to be discharged to:: Private residence Living Arrangements: Spouse/significant other;Children Available Help at Discharge: Family Type of Home: House Home Access: Stairs to enter [employer] of Steps: 5 into front entrance and 13 to second floor Entrance Stairs-Rails: Right;Left (can only reach one at a time) Home Layout: Two level     Bathroom Shower/Tub: Walk-in Pensions consultant: Handicapped height Bathroom Accessibility: Yes How Accessible: Accessible via walker Home Equipment: Shower seat;Adaptive equipment;Grab bars - tub/shower;Walker - 2 wheels Adaptive Equipment: Reacher Additional Comments: pt was independent in all ADLs and receiving HH PT and OT but OT had just discharged her.  She was using FWW and rollator in the home.  Sleeps in a recliner.      Prior Functioning/Environment Level of Independence: Independent with assistive device(s)                 OT Problem List: Decreased strength;Decreased activity tolerance;Obesity;Impaired balance (sitting and/or standing);Other (comment) (SOB at rest and with exertion)      OT Treatment/Interventions: Self-care/ADL training;Therapeutic exercise;Patient/family education;Energy conservation;DME and/or AE instruction;Balance training;Therapeutic activities    OT Goals(Current goals can be found in the care plan section) Acute Rehab  OT Goals Patient Stated Goal: "to go home" OT Goal Formulation: With patient/family Time For Goal Achievement: 05/25/16 Potential to Achieve Goals: Good ADL Goals Pt Will Perform Lower Body Dressing: with set-up;with supervision;sit to/from stand (using FWW and no LOB) Pt Will Transfer to Toilet: with set-up;with min assist;bedside commode (using FWW) Pt/caregiver will Perform Home Exercise Program: With written HEP provided;Both right and left upper extremity;With theraband (using pursed lip breathing )  OT Frequency: Min 1X/week   Barriers to D/C:    pt lives in [place]       Co-evaluation              End of Session Nurse Communication: Other (comment) (pt with headache of 8/10 and  has had high BP and nsg indicated she received BP meds this morning)  Activity Tolerance: Patient limited by fatigue Patient left: in chair;with call bell/phone within reach;with family/visitor present  OT Visit Diagnosis: Unsteadiness on feet (R26.81);Muscle weakness (generalized) (M62.81)                Time: 4098-1191 OT Time Calculation (min): 38 min Charges:  OT General Charges $OT Visit: 1 Procedure OT Evaluation $OT Eval Low Complexity: 1 Procedure OT Treatments $Self Care/Home Management : 23-37 mins G-Codes:     Susanne Borders, OTR/L ascom [phone] 05/11/16, 2:28 PM

## 2016-05-11 NOTE — Progress Notes (Signed)
[facility] [place] [facility]   PATIENT NAME: Lindsay Mathews    MR#:  [id number]  DATE OF BIRTH:  20-Dec-1945  SUBJECTIVE:  Kidney function improving, feeling better, waiting for physical therapy evaluation, blood pressure up REVIEW OF SYSTEMS:    Review of Systems  Constitutional: Negative.  Negative for chills, fever and malaise/fatigue.  HENT: Negative.  Negative for ear discharge, ear pain, hearing loss, nosebleeds and sore throat.   Eyes: Negative.  Negative for blurred vision and pain.  Respiratory: Negative.  Negative for cough, hemoptysis, shortness of breath and wheezing.   Cardiovascular: Negative.  Negative for chest pain, palpitations and leg swelling.  Gastrointestinal: Negative.  Negative for abdominal pain, blood in stool, diarrhea, nausea and vomiting.  Genitourinary: Negative.  Negative for dysuria.  Musculoskeletal: Negative.  Negative for back pain.  Skin: Negative.   Neurological: Negative for dizziness, tremors, speech change, focal weakness, seizures and headaches.  Endo/Heme/Allergies: Negative.  Does not bruise/bleed easily.  Psychiatric/Behavioral: Negative.  Negative for depression, hallucinations and suicidal ideas.    Tolerating Diet: yes DRUG ALLERGIES:  No Known Allergies  VITALS:  Blood pressure (!) 170/85, pulse 94, temperature 97.6 F (36.4 C), temperature source Oral, resp. rate (!) 22, height  (1.702 m), weight (!) 142.7 kg (314 lb 11.2 oz), SpO2 96 %.  PHYSICAL EXAMINATION:   Physical Exam  Constitutional: She is oriented to person, place, and time and well-developed, well-nourished, and in no distress. No distress.  HENT:  Head: Normocephalic.  Eyes: No scleral icterus.  Neck: Normal range of motion. Neck supple. No JVD present. No tracheal deviation present.  Cardiovascular: Normal rate, regular rhythm and normal heart sounds.  Exam reveals no gallop and no friction rub.   No murmur  heard. Pulmonary/Chest: Effort normal and breath sounds normal. No respiratory distress. She has no wheezes. She has no rales. She exhibits no tenderness.  Abdominal: Soft. Bowel sounds are normal. She exhibits no distension and no mass. There is no tenderness. There is no rebound and no guarding.  Musculoskeletal: Normal range of motion. She exhibits no edema.  Neurological: She is alert and oriented to person, place, and time.  Skin: Skin is warm. No rash noted. No erythema.  Psychiatric: Affect and judgment normal.   Left knee with anterior arthroplasty scar which is well-healed without any surrounding erythema, induration or pus.  Decreased ROM LEft leg   LABORATORY PANEL:   CBC  Recent Labs Lab 05/11/16 0500  WBC 9.8  HGB 10.2*  HCT 30.1*  PLT 278   ------------------------------------------------------------------------------------------------------------------  Chemistries   Recent Labs Lab 05/08/16 1432  05/10/16 0441 05/11/16 0500  NA 132*  < > 133* 139  K 5.3*  < > 4.4 3.8  CL 100*  < > 107 112*  CO2 17*  < > 19* 19*  GLUCOSE 95  < > 133* 113*  BUN 81*  < > 72* 56*  CREATININE 7.13*  < > 4.55* 3.15*  CALCIUM 8.3*  < > 8.1* 8.7*  MG  --   < > 2.0  --   AST 27  --   --   --   ALT 24  --   --   --   ALKPHOS 51  --   --   --   BILITOT 0.4  --   --   --   < > = values in this interval not displayed. ------------------------------------------------------------------------------------------------------------------  Cardiac Enzymes  Recent Labs Lab 05/08/16 1432 05/09/16  1050 05/09/16 2017  TROPONINI <0.03 <0.03 <0.03   ------------------------------------------------------------------------------------------------------------------  RADIOLOGY:  US Renal  Result Date: 05/09/2016 CLINICAL DATA:  Acute renal failure. EXAM: RENAL / URINARY TRACT ULTRASOUND COMPLETE COMPARISON:  None. FINDINGS: Right Kidney: Length: 10.8 cm. Mild renal cortical thinning and  prominent renal sinus fat. No hydronephrosis or focal cortical lesion. Left Kidney: Length: 10.7 cm. Mild renal cortical thinning and prominent renal sinus fat. No hydronephrosis or focal cortical lesion. Bladder: Decompressed by Foley catheter.  No apparent abnormality. IMPRESSION: 1. No hydronephrosis. The bladder is decompressed by the Foley catheter. 2. Both kidneys are normal in size, although demonstrate mild cortical thinning and prominent sinus fat. Electronically Signed   By: Carey Bullocks M.D.   On: 05/09/2016 17:09   US Venous Img Lower Bilateral  Result Date: 05/09/2016 CLINICAL DATA:  Bilateral lower extremity pain EXAM: BILATERAL LOWER EXTREMITY VENOUS DUPLEX ULTRASOUND TECHNIQUE: Gray-scale sonography with graded compression, as well as color Doppler and duplex ultrasound were performed to evaluate the lower extremity deep venous systems from the level of the common femoral vein and including the common femoral, femoral, profunda femoral, popliteal and calf veins including the posterior tibial, peroneal and gastrocnemius veins when visible. The superficial great saphenous vein was also interrogated. Spectral Doppler was utilized to evaluate flow at rest and with distal augmentation maneuvers in the common femoral, femoral and popliteal veins. COMPARISON:  None. FINDINGS: RIGHT LOWER EXTREMITY Common Femoral Vein: No evidence of thrombus. Normal compressibility, respiratory phasicity and response to augmentation. Saphenofemoral Junction: No evidence of thrombus. Normal compressibility and flow on color Doppler imaging. Profunda Femoral Vein: No evidence of thrombus. Normal compressibility and flow on color Doppler imaging. Femoral Vein: No evidence of thrombus. Normal compressibility, respiratory phasicity and response to augmentation. Popliteal Vein: No evidence of thrombus. Normal compressibility, respiratory phasicity and response to augmentation. Calf Veins: No evidence of thrombus. Normal  compressibility and flow on color Doppler imaging. Superficial Great Saphenous Vein: No evidence of thrombus. Normal compressibility and flow on color Doppler imaging. Venous Reflux:  None. Other Findings:  None. LEFT LOWER EXTREMITY Common Femoral Vein: No evidence of thrombus. Normal compressibility, respiratory phasicity and response to augmentation. Saphenofemoral Junction: No evidence of thrombus. Normal compressibility and flow on color Doppler imaging. Profunda Femoral Vein: No evidence of thrombus. Normal compressibility and flow on color Doppler imaging. Femoral Vein: No evidence of thrombus. Normal compressibility, respiratory phasicity and response to augmentation. Popliteal Vein: No evidence of thrombus. Normal compressibility, respiratory phasicity and response to augmentation. Calf Veins: No evidence of thrombus. Normal compressibility and flow on color Doppler imaging. Superficial Great Saphenous Vein: No evidence of thrombus. Normal compressibility and flow on color Doppler imaging. Venous Reflux:  None. Other Findings:  None. IMPRESSION: No evidence of deep venous thrombosis in either lower extremity. Electronically Signed   By: Bretta Bang III M.D.   On: 05/09/2016 17:13     ASSESSMENT AND PLAN:  71 year old female with recent left knee replacement, atrial fibrillation chronic kidney disease stage III who presented with hypotension after fall.  1 septic shock with severe hypotension and tachycardia on admission due to UTI Patient off pressors blood cultures neg Stop all antibiotics  * E coli UTI -Urine culture only growing 50,000 colonies - Stop all antibiotics at this point. - This has been treated  - Patient remains afebrile  2. Acute kidney injury in the setting of chronic kidney disease stage III with a baseline creatinine of 1.2 Acute kidney injury due to poor  by mouth intake and use of nephrotoxic medications in addition to urinary tract infection Continue IV  fluids Creat 4.5->3.15  3. Hyperkalemia with metabolic acidosis Patient on [place] daily as per nephrology  4. Syncope due to severe hypotension   Management plans discussed with the patient, nursing and they r in agreement.   await PT eval   CODE STATUS: full  TOTAL TIME TAKING CARE OF THIS PATIENT: 30 minutes.     POSSIBLE D/C 1 days, DEPENDING ON CLINICAL CONDITION.   Delfino Lovett M.D on 05/11/2016 at 1:51 PM  Between 7am to 6pm - Pager - [phone] After 6pm go to [email or web address] - Social research officer, government  [facility]  Office  [phone]  CC: Primary care physician; PROVIDER NOT IN SYSTEM  Note: This dictation was prepared with Dragon dictation along with smaller phrase technology. Any transcriptional errors that result from this process are unintentional.

## 2016-05-11 NOTE — Progress Notes (Signed)
[facility]  ROUNDING NOTE   Subjective:  Patient seen at bedside. Creatinine down to 3.15. IV fluids have been stopped. Good urine output noted.   Objective:  Vital signs in last 24 hours:  Temp:  [97.4 F (36.3 C)-98.7 F (37.1 C)] 97.7 F (36.5 C) (04/05 0928) Pulse Rate:  [76-96] 88 (04/05 1025) Resp:  [18-22] 22 (04/05 1025) BP: (116-194)/(67-76) 159/72 (04/05 1025) SpO2:  [94 %-100 %] 97 % (04/05 1025) Weight:  [142.7 kg (314 lb 11.2 oz)] 142.7 kg (314 lb 11.2 oz) (04/05 0500)  Weight change: 0.247 kg (8.7 oz) Filed Weights   05/09/16 0454 05/10/16 0424 05/11/16 0500  Weight: (!) 140.8 kg (310 lb 6.5 oz) (!) 142.5 kg (314 lb 2.5 oz) (!) 142.7 kg (314 lb 11.2 oz)    Intake/Output: I/O last 3 completed shifts: In: 2396.3 [P.O.:480; I.V.:1766.3; IV Piggyback:150] Out: 4925 [Urine:4925]   Intake/Output this shift:  Total I/O In: 0  Out: 500 [Urine:500]  Physical Exam: General: No acute distress  Head: Normocephalic, atraumatic. Moist oral mucosal membranes  Eyes: Anicteric  Neck: Supple, trachea midline  Lungs:  Clear to auscultation, normal effort  Heart: S1S2 no rubs  Abdomen:  Soft, nontender  Extremities: 1+ peripheral edema.  Neurologic: Nonfocal, moving all four extremities  Skin: No lesions       Basic Metabolic Panel:  Recent Labs Lab 05/08/16 1432 05/08/16 1941 05/08/16 2301 05/09/16 0443 05/10/16 0441 05/11/16 0500  NA 132* 129*  --  131* 133* 139  K 5.3* 5.3*  --  5.7* 4.4 3.8  CL 100* 100*  --  105 107 112*  CO2 17* 18*  --  17* 19* 19*  GLUCOSE 95 93  --  116* 133* 113*  BUN 81* 76*  --  74* 72* 56*  CREATININE 7.13* 6.70*  --  6.15* 4.55* 3.15*  CALCIUM 8.3* 7.9*  --  7.4* 8.1* 8.7*  MG  --   --  2.2 2.1 2.0  --   PHOS  --   --  9.1*  --   --   --     Liver Function Tests:  Recent Labs Lab 05/08/16 1432  AST 27  ALT 24  ALKPHOS 51  BILITOT 0.4  PROT 6.9  ALBUMIN 3.2*   No results for input(s): LIPASE,  AMYLASE in the last 168 hours. No results for input(s): AMMONIA in the last 168 hours.  CBC:  Recent Labs Lab 05/08/16 1432 05/09/16 0443 05/10/16 0441 05/11/16 0500  WBC 10.5 7.3 6.6 9.8  NEUTROABS 7.8*  --   --   --   HGB 9.9* 9.5* 9.5* 10.2*  HCT 29.6* 28.3* 28.1* 30.1*  MCV 90.5 90.3 88.9 89.0  PLT 254 221 226 278    Cardiac Enzymes:  Recent Labs Lab 05/08/16 1432 05/09/16 1050 05/09/16 2017  TROPONINI <0.03 <0.03 <0.03    BNP: Invalid input(s): POCBNP  CBG:  Recent Labs Lab 05/08/16 1851  GLUCAP 94    Microbiology: Results for orders placed or performed during the hospital encounter of 05/08/16  Culture, blood (routine x 2)     Status: None (Preliminary result)   Collection Time: 05/08/16  7:41 PM  Result Value Ref Range Status   Specimen Description BLOOD RIGHT [facility]  Final   Special Requests   Final    BOTTLES DRAWN AEROBIC AND ANAEROBIC Blood Culture adequate volume   Culture NO GROWTH 3 DAYS  Final   Report Status PENDING  Incomplete  Culture,  blood (routine x 2)     Status: None (Preliminary result)   Collection Time: 05/08/16  7:41 PM  Result Value Ref Range Status   Specimen Description BLOOD LEFT HAND  Final   Special Requests   Final    BOTTLES DRAWN AEROBIC AND ANAEROBIC Blood Culture adequate volume   Culture NO GROWTH 3 DAYS  Final   Report Status PENDING  Incomplete  Urine culture     Status: Abnormal   Collection Time: 05/09/16  1:30 AM  Result Value Ref Range Status   Specimen Description URINE, CATHETERIZED  Final   Special Requests NONE  Final   Culture 50,000 COLONIES/mL ESCHERICHIA COLI (A)  Final   Report Status 05/11/2016 FINAL  Final   Organism ID, Bacteria ESCHERICHIA COLI (A)  Final      Susceptibility   Escherichia coli - MIC*    AMPICILLIN 8 SENSITIVE Sensitive     CEFAZOLIN <=4 SENSITIVE Sensitive     CEFTRIAXONE <=1 SENSITIVE Sensitive     CIPROFLOXACIN <=0.25 SENSITIVE Sensitive     GENTAMICIN <=1 SENSITIVE  Sensitive     IMIPENEM <=0.25 SENSITIVE Sensitive     NITROFURANTOIN 128 RESISTANT Resistant     TRIMETH/SULFA >=320 RESISTANT Resistant     AMPICILLIN/SULBACTAM 4 SENSITIVE Sensitive     PIP/TAZO <=4 SENSITIVE Sensitive     Extended ESBL NEGATIVE Sensitive     * 50,000 COLONIES/mL ESCHERICHIA COLI  MRSA PCR Screening     Status: None   Collection Time: 05/09/16 10:51 AM  Result Value Ref Range Status   MRSA by PCR NEGATIVE NEGATIVE Final    Comment:        The GeneXpert MRSA Assay (FDA approved for NASAL specimens only), is one component of a comprehensive MRSA colonization surveillance program. It is not intended to diagnose MRSA infection nor to guide or monitor treatment for MRSA infections.     Coagulation Studies: No results for input(s): LABPROT, INR in the last 72 hours.  Urinalysis:  Recent Labs  05/09/16 0130  COLORURINE YELLOW*  LABSPEC 1.008  PHURINE 5.0  GLUCOSEU NEGATIVE  HGBUR MODERATE*  BILIRUBINUR NEGATIVE  KETONESUR NEGATIVE  PROTEINUR NEGATIVE  NITRITE NEGATIVE  LEUKOCYTESUR LARGE*      Imaging: US Renal  Result Date: 05/09/2016 CLINICAL DATA:  Acute renal failure. EXAM: RENAL / URINARY TRACT ULTRASOUND COMPLETE COMPARISON:  None. FINDINGS: Right Kidney: Length: 10.8 cm. Mild renal cortical thinning and prominent renal sinus fat. No hydronephrosis or focal cortical lesion. Left Kidney: Length: 10.7 cm. Mild renal cortical thinning and prominent renal sinus fat. No hydronephrosis or focal cortical lesion. Bladder: Decompressed by Foley catheter.  No apparent abnormality. IMPRESSION: 1. No hydronephrosis. The bladder is decompressed by the Foley catheter. 2. Both kidneys are normal in size, although demonstrate mild cortical thinning and prominent sinus fat. Electronically Signed   By: Carey Bullocks M.D.   On: 05/09/2016 17:09   US Venous Img Lower Bilateral  Result Date: 05/09/2016 CLINICAL DATA:  Bilateral lower extremity pain EXAM: BILATERAL  LOWER EXTREMITY VENOUS DUPLEX ULTRASOUND TECHNIQUE: Gray-scale sonography with graded compression, as well as color Doppler and duplex ultrasound were performed to evaluate the lower extremity deep venous systems from the level of the common femoral vein and including the common femoral, femoral, profunda femoral, popliteal and calf veins including the posterior tibial, peroneal and gastrocnemius veins when visible. The superficial great saphenous vein was also interrogated. Spectral Doppler was utilized to evaluate flow at rest and with distal augmentation  maneuvers in the common femoral, femoral and popliteal veins. COMPARISON:  None. FINDINGS: RIGHT LOWER EXTREMITY Common Femoral Vein: No evidence of thrombus. Normal compressibility, respiratory phasicity and response to augmentation. Saphenofemoral Junction: No evidence of thrombus. Normal compressibility and flow on color Doppler imaging. Profunda Femoral Vein: No evidence of thrombus. Normal compressibility and flow on color Doppler imaging. Femoral Vein: No evidence of thrombus. Normal compressibility, respiratory phasicity and response to augmentation. Popliteal Vein: No evidence of thrombus. Normal compressibility, respiratory phasicity and response to augmentation. Calf Veins: No evidence of thrombus. Normal compressibility and flow on color Doppler imaging. Superficial Great Saphenous Vein: No evidence of thrombus. Normal compressibility and flow on color Doppler imaging. Venous Reflux:  None. Other Findings:  None. LEFT LOWER EXTREMITY Common Femoral Vein: No evidence of thrombus. Normal compressibility, respiratory phasicity and response to augmentation. Saphenofemoral Junction: No evidence of thrombus. Normal compressibility and flow on color Doppler imaging. Profunda Femoral Vein: No evidence of thrombus. Normal compressibility and flow on color Doppler imaging. Femoral Vein: No evidence of thrombus. Normal compressibility, respiratory phasicity and  response to augmentation. Popliteal Vein: No evidence of thrombus. Normal compressibility, respiratory phasicity and response to augmentation. Calf Veins: No evidence of thrombus. Normal compressibility and flow on color Doppler imaging. Superficial Great Saphenous Vein: No evidence of thrombus. Normal compressibility and flow on color Doppler imaging. Venous Reflux:  None. Other Findings:  None. IMPRESSION: No evidence of deep venous thrombosis in either lower extremity. Electronically Signed   By: Bretta Bang III M.D.   On: 05/09/2016 17:13     Medications:    . apixaban  5 mg Oral BID  . diltiazem  180 mg Oral Daily  . famotidine  20 mg Oral QHS  . metoprolol succinate  100 mg Oral Daily   acetaminophen, docusate sodium, oxyCODONE  Assessment/ Plan:  71 y.o. female with a PMHx of Left knee replacement, atrial fibrillation, hypertension, chronic kidney disease stage III Baseline creatinine 1.2, peripheral neuropathy who was admitted to [facility] on 05/08/2016 for evaluation of hypotension and fall.   1.  Acute renal failure/CKD stage III baseline Cr 1.2.  Suspect acute renal failure now related to poor by mouth intake, use of lisinopril/HCTZ, and urinary tract infection.  - Kidney function continues to improve. Creatinine down to 3.15. We have discontinued IV fluids. Continue to monitor renal function trend for at least 1 more day.  2. Hypotension. Now resolved. Okay to continue diltiazem and metoprolol.  3. Hyperkalemia.  Potassium down to 3.8. No further need for patiromer.   LOS: 3 Kenneth Lax 4/5/201811:43 AM

## 2016-05-11 NOTE — Evaluation (Signed)
Physical Therapy Evaluation Patient Details Name: Lindsay Mathews MRN: [id number] DOB: 11-13-1945 Today's Date: 05/11/2016   History of Present Illness  Lindsay Mathews  is a 71 y.o. 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 She says that she was getting up to walk from her car in a rest stop bathroom when she felt weak in her knees and fell to the ground. She says that she hit a bar on the bathroom wall to her upper back and is now having aching pain to the upper back. Denies that she completely passed out. To the best of the memory of her daughter and the patient her last creatinine several months ago was about 2 and may have gone down to 1 while she was at rehab after knee surgery per her daughter.  The patient was on a trip from [place] back home to [place] when this event happened while at rest area. Had left knee replacement in November 17 and had to change knee cap in Jan'18 (?Sepsis/fall) and developed atrial fibrillation for which she is on eliquis. Septic shock with severe hypotension and tachycardia on admission due to UTI   Clinical Impression  Prior to hospital admission, pt was ambulating with RW (occasionally using rollator).  Pt lives with her husband on 2nd floor of home (daughter and son-in-law live on main floor of home).  Currently pt is CGA with supine to sit, min to mod assist with transfers, and min assist ambulating a few feet with RW.  Pt appearing very fatigued and demonstrating significant SOB with limited mobility during session requiring rest breaks.  Pt would benefit from skilled PT to address noted impairments and functional limitations.  Recommend pt discharge to STR when medically appropriate.    Follow Up Recommendations SNF    Equipment Recommendations  Rolling walker with 5" wheels;3in1 (PT) (pt already owns RW)    Recommendations for Other Services        Precautions / Restrictions Precautions Precautions: Fall Restrictions Weight Bearing Restrictions: No      Mobility  Bed Mobility Overal bed mobility: Needs Assistance Bed Mobility: Supine to Sit     Supine to sit: Min guard;HOB elevated     General bed mobility comments: increased effort and time to perform on own  Transfers Overall transfer level: Needs assistance Equipment used: Rolling walker (2 wheeled) Transfers: Sit to/from [employer] Sit to Stand: Min assist;Mod assist Stand pivot transfers: Min assist (stand step turn bed to/from bedside commode)       General transfer comment: min assist to stand from bed (x2 trials); mod assist to stand from bedside commode; vc's for hand and feet placement/positioning required; assist to initiate stand  Ambulation/Gait Ambulation/Gait assistance: Min assist Ambulation Distance (Feet): 3 Feet Assistive device: Rolling walker (2 wheeled)   Gait velocity: decreased   General Gait Details: decreased B step length/foot clearance/heelstrike; heavy use of UE's on RW  Stairs Stairs:  (Not appropriate at this time d/t weakness and decreased activity tolerance)          Wheelchair Mobility    Modified Rankin (Stroke Patients Only)       Balance Overall balance assessment: Needs assistance;History of Falls Sitting-balance support: Bilateral upper extremity supported;Feet supported Sitting balance-Leahy Scale: Good Sitting balance - Comments: dynamic sitting reaching within BOS   Standing balance support: Bilateral upper extremity supported (on RW) Standing balance-Leahy Scale: Fair Standing balance comment: standing while  getting cleaned up post toileting                             Pertinent Vitals/Pain Pain Assessment: 0-10 Pain Score: 8  Pain Location: head in front right side Pain Descriptors / Indicators: Constant;Discomfort;Pressure Pain Intervention(s): Limited activity within  patient's tolerance;Monitored during session;Premedicated before session  Vitals (HR and O2 on room air) stable and WFL throughout treatment session.    Home Living Family/patient expects to be discharged to:: Private residence Living Arrangements: Spouse/significant other;Children Available Help at Discharge: Family Type of Home: House Home Access: Stairs to enter Entrance Stairs-Rails: Doctor, general practice of Steps: 5 into front entrance (B rail but far apart) and 14 to second floor (B rail close together) Home Layout: Two level Home Equipment: Shower seat;Adaptive equipment;Grab bars - tub/shower;Walker - 2 wheels;Walker - 4 wheels;Grab bars - toilet Additional Comments: Pt was independent in all ADLs and receiving HH PT and OT but OT had just discharged her.  She was using FWW and rollator in the home.  Sleeps in a recliner.    Prior Function Level of Independence: Independent with assistive device(s)         Comments: Pt reports a couple falls on stairs in home in past few months.     Hand Dominance   Dominant Hand: Right    Extremity/Trunk Assessment   Upper Extremity Assessment Upper Extremity Assessment: Generalized weakness    Lower Extremity Assessment Lower Extremity Assessment: Generalized weakness       Communication   Communication: No difficulties  Cognition Arousal/Alertness: Awake/alert Behavior During Therapy: WFL for tasks assessed/performed Overall Cognitive Status: Within Functional Limits for tasks assessed                                 General Comments: Pt's husband and daughter present beginning of session but left during session.  Nursing cleared pt for participation in physical therapy.  Pt agreeable to PT session.      General Comments General comments (skin integrity, edema, etc.): Pt requesting to toilet beginning of session    Exercises     Assessment/Plan    PT Assessment Patient needs continued PT  services  PT Problem List Decreased strength;Decreased activity tolerance;Decreased balance;Decreased mobility;Pain       PT Treatment Interventions DME instruction;Gait training;Stair training;Functional mobility training;Therapeutic activities;Therapeutic exercise;Balance training;Patient/family education    PT Goals (Current goals can be found in the Care Plan section)  Acute Rehab PT Goals Patient Stated Goal: to go home PT Goal Formulation: With patient Time For Goal Achievement: 05/25/16 Potential to Achieve Goals: Fair    Frequency Min 2X/week   Barriers to discharge Decreased caregiver support      Co-evaluation               End of Session Equipment Utilized During Treatment: Gait belt Activity Tolerance: Patient limited by fatigue Patient left: in chair;with call bell/phone within reach;with chair alarm set;with nursing/sitter in room Nurse Communication: Mobility status;Precautions PT Visit Diagnosis: Muscle weakness (generalized) (M62.81);History of falling (Z91.81);Difficulty in walking, not elsewhere classified (R26.2);Pain Pain - Right/Left: Left Pain - part of body: Knee    Time: 1100-1145 PT Time Calculation (min) (ACUTE ONLY): 45 min   Charges:   PT Evaluation $PT Eval Low Complexity: 1 Procedure PT Treatments $Therapeutic Activity: 23-37 mins   PT G Codes:  Hendricks Limes, PT 05/11/16, 3:19 PM [phone]

## 2016-05-11 NOTE — Progress Notes (Signed)
Patient appeared to be SOB with scattered wheezing but O2 sats was 94%. Bil.legs also are edematous. Prime doc and Nephro doc dc'd the NS infusion. We will continue to monitor.

## 2016-05-12 LAB — CBC
HEMATOCRIT: 30.6 % — AB (ref 35.0–47.0)
Hemoglobin: 10.6 g/dL — ABNORMAL LOW (ref 12.0–16.0)
MCH: 30.8 pg (ref 26.0–34.0)
MCHC: 34.5 g/dL (ref 32.0–36.0)
MCV: 89.4 fL (ref 80.0–100.0)
PLATELETS: 309 10*3/uL (ref 150–440)
RBC: 3.43 MIL/uL — ABNORMAL LOW (ref 3.80–5.20)
RDW: 14.7 % — AB (ref 11.5–14.5)
WBC: 10.4 10*3/uL (ref 3.6–11.0)

## 2016-05-12 LAB — BASIC METABOLIC PANEL
ANION GAP: 10 (ref 5–15)
BUN: 36 mg/dL — ABNORMAL HIGH (ref 6–20)
CALCIUM: 9.1 mg/dL (ref 8.9–10.3)
CHLORIDE: 109 mmol/L (ref 101–111)
CO2: 21 mmol/L — ABNORMAL LOW (ref 22–32)
CREATININE: 2.1 mg/dL — AB (ref 0.44–1.00)
GFR calc Af Amer: 26 mL/min — ABNORMAL LOW (ref >60)
GFR calc non Af Amer: 23 mL/min — ABNORMAL LOW (ref >60)
GLUCOSE: 119 mg/dL — AB (ref 65–99)
Potassium: 3.4 mmol/L — ABNORMAL LOW (ref 3.5–5.1)
Sodium: 140 mmol/L (ref 135–145)

## 2016-05-12 MED ORDER — LORAZEPAM 2 MG/ML IJ SOLN
1.0000 mg | INTRAMUSCULAR | Status: AC
  Administered 2016-05-12: 1 mg via INTRAVENOUS
  Filled 2016-05-12: qty 1

## 2016-05-12 MED ORDER — SENNOSIDES-DOCUSATE SODIUM 8.6-50 MG PO TABS
2.0000 | ORAL_TABLET | ORAL | Status: AC
  Administered 2016-05-12: 2 via ORAL
  Filled 2016-05-12: qty 2

## 2016-05-12 MED ORDER — HYDROCODONE-ACETAMINOPHEN 7.5-325 MG PO TABS
1.0000 | ORAL_TABLET | ORAL | Status: AC
  Administered 2016-05-12: 1 via ORAL
  Filled 2016-05-12: qty 1

## 2016-05-12 MED ORDER — LORAZEPAM 1 MG PO TABS: 1.0000 mg | ORAL_TABLET | ORAL | Status: DC

## 2016-05-12 MED ORDER — LORAZEPAM 2 MG/ML IJ SOLN: 1.0000 mg | INTRAMUSCULAR | Status: DC

## 2016-05-12 NOTE — Clinical Social Work Note (Signed)
CSW consulted for New SNF. CSW met with pt and husband to address consult. Pt was on vacation when she was admitted to the hospital. PT is recommending SNF, and pt expressed an interest in going to a facility in the [place] area but preference is to return home. Per MD, pt will return home with home health, which RNCM fkollowing for. CSW is signing off as no further needs idenified.   Darden Dates, MSW, LCSW  Clinical Social Worker [phone]

## 2016-05-12 NOTE — Discharge Summary (Addendum)
[facility] [place] [facility]   PATIENT NAME: Lindsay Mathews    MR#:  [id number]  DATE OF BIRTH:  Apr 01, 1945  DATE OF ADMISSION:  05/08/2016   ADMITTING PHYSICIAN: Delfino Lovett, MD  DATE OF DISCHARGE: 05/12/2016  3:29 PM  PRIMARY CARE PHYSICIAN: PROVIDER NOT IN SYSTEM in [place], [place]  ADMISSION DIAGNOSIS:  Leg pain [M79.606] Dvt femoral (deep venous thrombosis) (HCC) [I82.419] Pain of lower extremity, unspecified laterality [M79.606] Hypotension, unspecified hypotension type [I95.9] Acute renal failure superimposed on chronic kidney disease, unspecified CKD stage, unspecified acute renal failure type (HCC) [N17.9, N18.9] DISCHARGE DIAGNOSIS:  Active Problems:   Hypotension   Acute renal failure superimposed on chronic kidney disease (HCC)  SECONDARY DIAGNOSIS:   Past Medical History:  Diagnosis Date  . Hypertension    HOSPITAL COURSE:  71 year old female with recent left knee replacement, atrial fibrillation chronic kidney disease stage III who presented with hypotension after fall.  * septic shock with severe hypotension and tachycardia on admission due to UTI Blood c/s remained neg. Due to e.coli UTI  * E coli UTI - Urine culture only growing 50,000 colonies - Stop all antibiotics at this point. - This has been treated   * Acute kidney injury in the setting of chronic kidney disease stage III with a baseline creatinine of 1.2 Acute kidney injury due to poor by mouth intake and use of nephrotoxic medications in addition to urinary tract infection  * Hyperkalemia with metabolic acidosis: resolved * Syncope due to severe hypotension: resolved DISCHARGE CONDITIONS:  stable CONSULTS OBTAINED:  Treatment Team:  Mady Haagensen, MD Shane Crutch, MD DRUG ALLERGIES:  No Known Allergies DISCHARGE MEDICATIONS:   Allergies as of 05/12/2016   No Known Allergies     Medication List    STOP taking these medications     lisinopril-hydrochlorothiazide 20-25 MG tablet Commonly known as:  PRINZIDE,ZESTORETIC     TAKE these medications   amitriptyline 10 MG tablet Commonly known as:  ELAVIL Take 20 mg by mouth at bedtime.   diltiazem 180 MG 24 hr capsule Commonly known as:  DILACOR XR Take 180 mg by mouth daily.   ELIQUIS 5 MG Tabs tablet Generic drug:  apixaban Take 5 mg by mouth 2 (two) times daily.   gabapentin 600 MG tablet Commonly known as:  NEURONTIN Take 600 mg by mouth 3 (three) times daily.   metoprolol succinate 100 MG 24 hr tablet Commonly known as:  TOPROL-XL Take 100 mg by mouth daily. Take with or immediately following a meal.        DISCHARGE INSTRUCTIONS:   DIET:  Regular diet DISCHARGE CONDITION:  Good ACTIVITY:  Activity as tolerated OXYGEN:  Home Oxygen: No.  Oxygen Delivery: room air DISCHARGE LOCATION:  home - patient was offered rehab but chose to go home  If you experience worsening of your admission symptoms, develop shortness of breath, life threatening emergency, suicidal or homicidal thoughts you must seek medical attention immediately by calling 911 or calling your MD immediately  if symptoms less severe.  You Must read complete instructions/literature along with all the possible adverse reactions/side effects for all the Medicines you take and that have been prescribed to you. Take any new Medicines after you have completely understood and accpet all the possible adverse reactions/side effects.   Please note  You were cared for by a hospitalist during your hospital stay. If you have any questions about your discharge medications or the care you received while you  were in the hospital after you are discharged, you can call the unit and asked to speak with the hospitalist on call if the hospitalist that took care of you is not available. Once you are discharged, your primary care physician will handle any further medical issues. Please note that NO REFILLS  for any discharge medications will be authorized once you are discharged, as it is imperative that you return to your primary care physician (or establish a relationship with a primary care physician if you do not have one) for your aftercare needs so that they can reassess your need for medications and monitor your lab values.    On the day of Discharge:  VITAL SIGNS:  Blood pressure (!) 166/76, pulse 70, temperature 98.1 F (36.7 C), temperature source Oral, resp. rate 16, height  (1.702 m), weight (!) 137.9 kg (304 lb), SpO2 95 %. PHYSICAL EXAMINATION:  GENERAL:  71 y.o.-year-old patient lying in the bed with no acute distress.  EYES: Pupils equal, round, reactive to light and accommodation. No scleral icterus. Extraocular muscles intact.  HEENT: Head atraumatic, normocephalic. Oropharynx and nasopharynx clear.  NECK:  Supple, no jugular venous distention. No thyroid enlargement, no tenderness.  LUNGS: Normal breath sounds bilaterally, no wheezing, rales,rhonchi or crepitation. No use of accessory muscles of respiration.  CARDIOVASCULAR: S1, S2 normal. No murmurs, rubs, or gallops.  ABDOMEN: Soft, non-tender, non-distended. Bowel sounds present. No organomegaly or mass.  EXTREMITIES: No pedal edema, cyanosis, or clubbing.  NEUROLOGIC: Cranial nerves II through XII are intact. Muscle strength 5/5 in all extremities. Sensation intact. Gait not checked.  PSYCHIATRIC: The patient is alert and oriented x 3.  SKIN: No obvious rash, lesion, or ulcer.  DATA REVIEW:   CBC  Recent Labs Lab 05/12/16 0500  WBC 10.4  HGB 10.6*  HCT 30.6*  PLT 309    Chemistries   Recent Labs Lab 05/08/16 1432  05/10/16 0441  05/12/16 0500  NA 132*  < > 133*  < > 140  K 5.3*  < > 4.4  < > 3.4*  CL 100*  < > 107  < > 109  CO2 17*  < > 19*  < > 21*  GLUCOSE 95  < > 133*  < > 119*  BUN 81*  < > 72*  < > 36*  CREATININE 7.13*  < > 4.55*  < > 2.10*  CALCIUM 8.3*  < > 8.1*  < > 9.1  MG  --   < >  2.0  --   --   AST 27  --   --   --   --   ALT 24  --   --   --   --   ALKPHOS 51  --   --   --   --   BILITOT 0.4  --   --   --   --   < > = values in this interval not displayed.   Urine c/s grew 50K e.coli    Management plans discussed with the patient, family and they are in agreement.  CODE STATUS: Prior   TOTAL TIME TAKING CARE OF THIS PATIENT: 45 minutes.    Delfino Lovett M.D on 05/12/2016 at 9:22 PM  Between 7am to 6pm - Pager - [phone]  After 6pm go to [email or web address] - Social research officer, government  [facility] [place] [facility]  Office  [phone]  CC: Primary care physician; PROVIDER NOT IN SYSTEM   Note: This dictation was prepared with  Dragon dictation along with smaller Company secretary. Any transcriptional errors that result from this process are unintentional.

## 2016-05-12 NOTE — Progress Notes (Signed)
[facility]  ROUNDING NOTE   Subjective:  Patient seen at bedside. Creatinine down to 2.1. She appears quite anxious today.  Objective:  Vital signs in last 24 hours:  Temp:  [97.6 F (36.4 C)-98.3 F (36.8 C)] 98.2 F (36.8 C) (04/06 0941) Pulse Rate:  [89-109] 99 (04/06 0941) Resp:  [22-24] 22 (04/06 0941) BP: (163-191)/(81-85) 163/81 (04/06 0941) SpO2:  [96 %-100 %] 96 % (04/06 0941) Weight:  [137.9 kg (304 lb)] 137.9 kg (304 lb) (04/06 0456)  Weight change: -4.853 kg (-10 lb 11.2 oz) Filed Weights   05/10/16 0424 05/11/16 0500 05/12/16 0456  Weight: (!) 142.5 kg (314 lb 2.5 oz) (!) 142.7 kg (314 lb 11.2 oz) (!) 137.9 kg (304 lb)    Intake/Output: I/O last 3 completed shifts: In: 0  Out: 3975 [Urine:3975]   Intake/Output this shift:  Total I/O In: -  Out: 200 [Urine:200]  Physical Exam: General: No acute distress  Head: Normocephalic, atraumatic. Moist oral mucosal membranes  Eyes: Anicteric  Neck: Supple, trachea midline  Lungs:  Clear to auscultation, normal effort  Heart: S1S2 no rubs  Abdomen:  Soft, nontender  Extremities: 1+ peripheral edema.  Neurologic: Nonfocal, moving all four extremities, anxious  Skin: No lesions       Basic Metabolic Panel:  Recent Labs Lab 05/08/16 1941 05/08/16 2301 05/09/16 0443 05/10/16 0441 05/11/16 0500 05/12/16 0500  NA 129*  --  131* 133* 139 140  K 5.3*  --  5.7* 4.4 3.8 3.4*  CL 100*  --  105 107 112* 109  CO2 18*  --  17* 19* 19* 21*  GLUCOSE 93  --  116* 133* 113* 119*  BUN 76*  --  74* 72* 56* 36*  CREATININE 6.70*  --  6.15* 4.55* 3.15* 2.10*  CALCIUM 7.9*  --  7.4* 8.1* 8.7* 9.1  MG  --  2.2 2.1 2.0  --   --   PHOS  --  9.1*  --   --   --   --     Liver Function Tests:  Recent Labs Lab 05/08/16 1432  AST 27  ALT 24  ALKPHOS 51  BILITOT 0.4  PROT 6.9  ALBUMIN 3.2*   No results for input(s): LIPASE, AMYLASE in the last 168 hours. No results for input(s): AMMONIA in the last  168 hours.  CBC:  Recent Labs Lab 05/08/16 1432 05/09/16 0443 05/10/16 0441 05/11/16 0500 05/12/16 0500  WBC 10.5 7.3 6.6 9.8 10.4  NEUTROABS 7.8*  --   --   --   --   HGB 9.9* 9.5* 9.5* 10.2* 10.6*  HCT 29.6* 28.3* 28.1* 30.1* 30.6*  MCV 90.5 90.3 88.9 89.0 89.4  PLT 254 221 226 278 309    Cardiac Enzymes:  Recent Labs Lab 05/08/16 1432 05/09/16 1050 05/09/16 2017  TROPONINI <0.03 <0.03 <0.03    BNP: Invalid input(s): POCBNP  CBG:  Recent Labs Lab 05/08/16 1851  GLUCAP 94    Microbiology: Results for orders placed or performed during the hospital encounter of 05/08/16  Culture, blood (routine x 2)     Status: None (Preliminary result)   Collection Time: 05/08/16  7:41 PM  Result Value Ref Range Status   Specimen Description BLOOD RIGHT AC  Final   Special Requests   Final    BOTTLES DRAWN AEROBIC AND ANAEROBIC Blood Culture adequate volume   Culture NO GROWTH 4 DAYS  Final   Report Status PENDING  Incomplete  Culture, blood (  routine x 2)     Status: None (Preliminary result)   Collection Time: 05/08/16  7:41 PM  Result Value Ref Range Status   Specimen Description BLOOD LEFT HAND  Final   Special Requests   Final    BOTTLES DRAWN AEROBIC AND ANAEROBIC Blood Culture adequate volume   Culture NO GROWTH 4 DAYS  Final   Report Status PENDING  Incomplete  Urine culture     Status: Abnormal   Collection Time: 05/09/16  1:30 AM  Result Value Ref Range Status   Specimen Description URINE, CATHETERIZED  Final   Special Requests NONE  Final   Culture 50,000 COLONIES/mL ESCHERICHIA COLI (A)  Final   Report Status 05/11/2016 FINAL  Final   Organism ID, Bacteria ESCHERICHIA COLI (A)  Final      Susceptibility   Escherichia coli - MIC*    AMPICILLIN 8 SENSITIVE Sensitive     CEFAZOLIN <=4 SENSITIVE Sensitive     CEFTRIAXONE <=1 SENSITIVE Sensitive     CIPROFLOXACIN <=0.25 SENSITIVE Sensitive     GENTAMICIN <=1 SENSITIVE Sensitive     IMIPENEM <=0.25  SENSITIVE Sensitive     NITROFURANTOIN 128 RESISTANT Resistant     TRIMETH/SULFA >=320 RESISTANT Resistant     AMPICILLIN/SULBACTAM 4 SENSITIVE Sensitive     PIP/TAZO <=4 SENSITIVE Sensitive     Extended ESBL NEGATIVE Sensitive     * 50,000 COLONIES/mL ESCHERICHIA COLI  MRSA PCR Screening     Status: None   Collection Time: 05/09/16 10:51 AM  Result Value Ref Range Status   MRSA by PCR NEGATIVE NEGATIVE Final    Comment:        The GeneXpert MRSA Assay (FDA approved for NASAL specimens only), is one component of a comprehensive MRSA colonization surveillance program. It is not intended to diagnose MRSA infection nor to guide or monitor treatment for MRSA infections.     Coagulation Studies: No results for input(s): LABPROT, INR in the last 72 hours.  Urinalysis: No results for input(s): COLORURINE, LABSPEC, PHURINE, GLUCOSEU, HGBUR, BILIRUBINUR, KETONESUR, PROTEINUR, UROBILINOGEN, NITRITE, LEUKOCYTESUR in the last 72 hours.  Invalid input(s): APPERANCEUR    Imaging: No results found.   Medications:    . apixaban  5 mg Oral BID  . diltiazem  180 mg Oral Daily  . famotidine  20 mg Oral QHS  . metoprolol succinate  100 mg Oral Daily   acetaminophen, docusate sodium, oxyCODONE  Assessment/ Plan:  71 y.o. female with a PMHx of Left knee replacement, atrial fibrillation, hypertension, chronic kidney disease stage III Baseline creatinine 1.2, peripheral neuropathy who was admitted to [facility] on 05/08/2016 for evaluation of hypotension and fall.   1.  Acute renal failure/CKD stage III baseline Cr 1.2.  Suspect acute renal failure now related to poor by mouth intake, use of lisinopril/HCTZ, and urinary tract infection.  - Creatinine continues to trend down daily. (Currently 36 with a creatinine of 2.10. Patient now off of IV fluids. Continue to monitor renal parameters daily.  2. Hypotension. Resolved now.  Anxiety maybe playing some role.  Back on diltiazem and metoprolol.    3. Hyperkalemia.  Resolved.     LOS: 4 Mesha Schamberger 4/6/201811:10 AM

## 2016-05-12 NOTE — Care Management Important Message (Signed)
Important Message  Patient Details  Name: Lindsay Mathews MRN: [id number] Date of Birth: [birth year]   Medicare Important Message Given:  Yes    Collie Siad, RN 05/12/2016, 1:36 PM

## 2016-05-12 NOTE — Discharge Instructions (Signed)
Acute Kidney Injury, Adult Acute kidney injury is a sudden worsening of kidney function. The kidneys are organs that have several jobs. They filter the blood to remove waste products and extra fluid. They also maintain a healthy balance of minerals and hormones in the body, which helps control blood pressure and keep bones strong. With this condition, your kidneys do not do their jobs as well as they should. This condition ranges from mild to severe. Over time it may develop into long-lasting (chronic) kidney disease. Early detection and treatment may prevent acute kidney injury from developing into a chronic condition. What are the causes? Common causes of this condition include:  A problem with blood flow to the kidneys. This may be caused by:  Low blood pressure (hypotension) or shock.  Blood loss.  Heart and blood vessel (cardiovascular) disease.  Severe burns.  Liver disease.  Direct damage to the kidneys. This may be caused by:  Certain medicines.  A kidney infection.  Poisoning.  Being around or in contact with toxic substances.  A surgical wound.  A hard, direct hit to the kidney area.  A sudden blockage of urine flow. This may be caused by:  Cancer.  Kidney stones.  An enlarged prostate in males. What are the signs or symptoms? Symptoms of this condition may not be obvious until the condition becomes severe. Symptoms of this condition can include:  Tiredness (lethargy), or difficulty staying awake.  Nausea or vomiting.  Swelling (edema) of the face, legs, ankles, or feet.  Problems with urination, such as:  Abdominal pain, or pain along the side of your stomach (flank).  Decreased urine production.  Decrease in the force of urine flow.  Muscle twitches and cramps, especially in the legs.  Confusion or trouble concentrating.  Loss of appetite.  Fever. How is this diagnosed? This condition may be diagnosed with tests, including:  Blood  tests.  Urine tests.  Imaging tests.  A test in which a sample of tissue is removed from the kidneys to be examined under a microscope (kidney biopsy). How is this treated? Treatment for this condition depends on the cause and how severe the condition is. In mild cases, treatment may not be needed. The kidneys may heal on their own. In more severe cases, treatment will involve:  Treating the cause of the kidney injury. This may involve changing any medicines you are taking or adjusting your dosage.  Fluids. You may need specialized IV fluids to balance your body's needs.  Having a catheter placed to drain urine and prevent blockages.  Preventing problems from occurring. This may mean avoiding certain medicines or procedures that can cause further injury to the kidneys. In some cases treatment may also require:  A procedure to remove toxic wastes from the body (dialysis or continuous renal replacement therapy - CRRT).  Surgery. This may be done to repair a torn kidney, or to remove the blockage from the urinary system. Follow these instructions at home: Medicines   Take over-the-counter and prescription medicines only as told by your health care provider.  Do not take any new medicines without your health care provider's approval. Many medicines can worsen your kidney damage.  Do not take any vitamin and mineral supplements without your health care provider's approval. Many nutritional supplements can worsen your kidney damage. Lifestyle   If your health care provider prescribed changes to your diet, follow them. You may need to decrease the amount of protein you eat.  Achieve and maintain a   healthy weight. If you need help with this, ask your health care provider.  Start or continue an exercise plan. Try to exercise at least 30 minutes a day, 5 days a week.  Do not use any tobacco products, such as cigarettes, chewing tobacco, and e-cigarettes. If you need help quitting, ask  your health care provider. General instructions   Keep track of your blood pressure. Report changes in your blood pressure as told by your health care provider.  Stay up to date with immunizations. Ask your health care provider which immunizations you need.  Keep all follow-up visits as told by your health care provider. This is important. Where to find more information:  American Association of Kidney Patients: www.aakp.org  National Kidney Foundation: www.kidney.org  American Kid[employer] Fund: www.akfinc.org  [employer] [facility]:  www.lifeoptions.org  www.kidneyschool.org Contact a health care provider if:  Your symptoms get worse.  You develop new symptoms. Get help right away if:  You develop symptoms of worsening kidney disease, which include:  Headaches.  Abnormally dark or light skin.  Easy bruising.  Frequent hiccups.  Chest pain.  Shortness of breath.  End of menstruation in women.  Seizures.  Confusion or altered mental status.  Abdominal or back pain.  Itchiness.  You have a fever.  Your body is producing less urine.  You have pain or bleeding when you urinate. Summary  Acute kidney injury is a sudden worsening of kidney function.  Acute kidney injury can be caused by problems with blood flow to the kidneys, direct damage to the kidneys, and sudden blockage of urine flow.  Symptoms of this condition may not be obvious until it becomes severe. Symptoms may include edema, lethargy, confusion, nausea or vomiting, and problems passing urine.  This condition can usually be diagnosed with blood tests, urine tests, and imaging tests. Sometimes a kidney biopsy is done to diagnose this condition.  Treatment for this condition often involves treating the underlying cause. It is treated with fluids, medicines, dialysis, diet changes, or surgery. This information is not intended to replace advice given to you by your health care provider.  Make sure you discuss any questions you have with your health care provider. Document Released: 08/08/2010 Document Revised: 01/14/2016 Document Reviewed: 01/14/2016 Elsevier Interactive Patient Education  2017 [employer]  

## 2016-05-12 NOTE — Progress Notes (Signed)
Alert and oriented. Vital signs stable . No signs of acute distress. Discharge instructions given. Patient verbalized understanding. No other issues noted at this time.    

## 2016-05-12 NOTE — Care Management (Signed)
Patient to discharge today.  Patient has decided to return home with home health services.  Notified [place] from [place] of pending discharge. Husband to transport at discharge.  Awaiting home health orders.

## 2016-05-13 LAB — CULTURE, BLOOD (ROUTINE X 2)
CULTURE: NO GROWTH
Culture: NO GROWTH
SPECIAL REQUESTS: ADEQUATE
Special Requests: ADEQUATE

## 2019-01-02 IMAGING — US US RENAL
1 series · 14 of 25 positions shown · non-contrast
Comparison: None.

CLINICAL DATA: Acute renal failure.

EXAM:
RENAL / URINARY TRACT ULTRASOUND COMPLETE

[Series 1: us renal · 0.28mm/px · 14 of 50 slices shown]
[im 1/50]
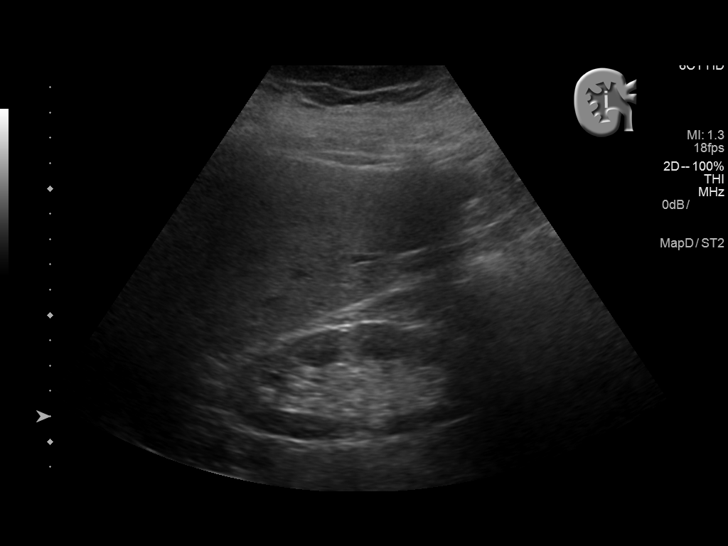
[im 5/50]
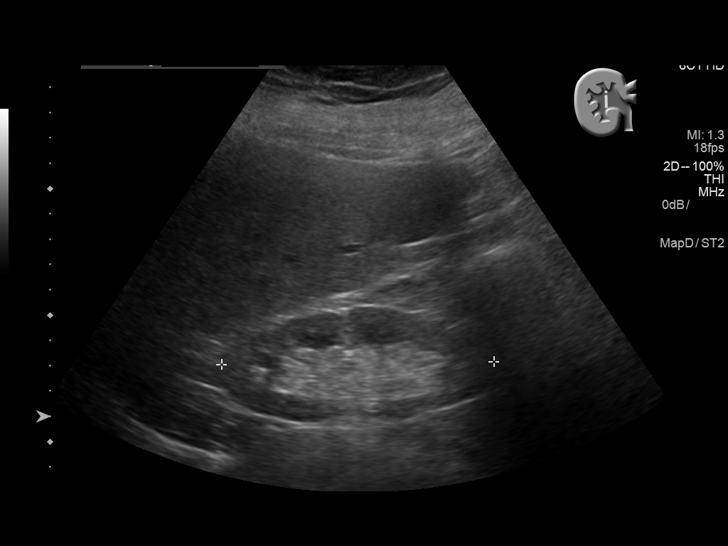
[im 9/50]
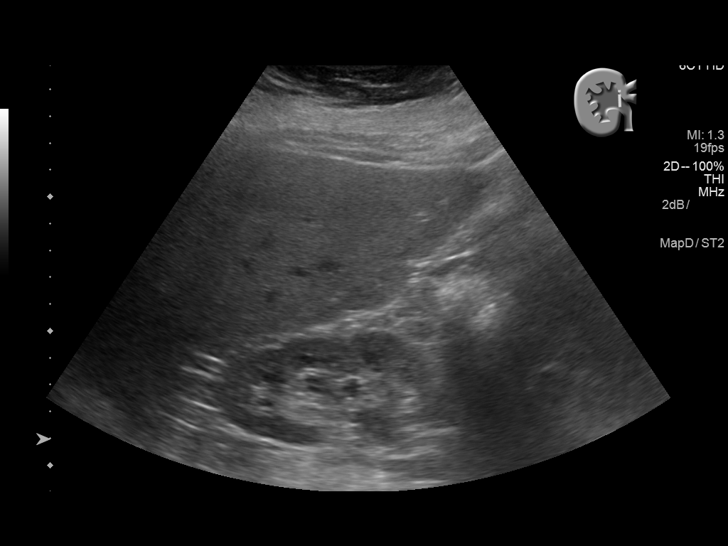
[im 13/50]
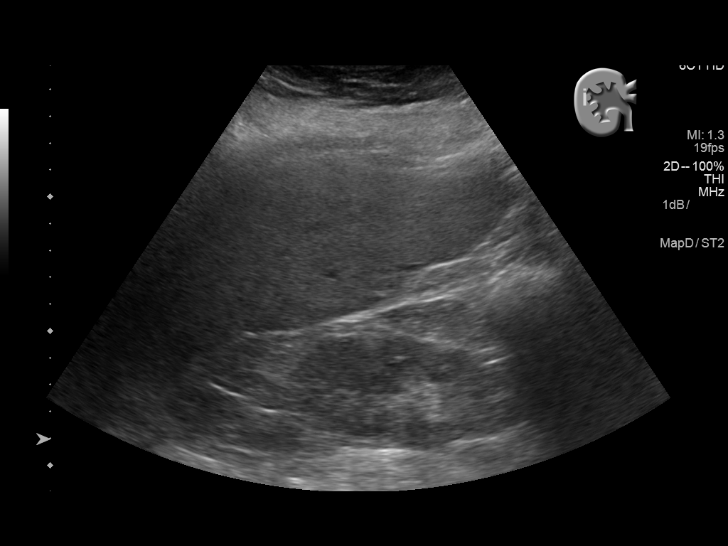
[im 17/50]
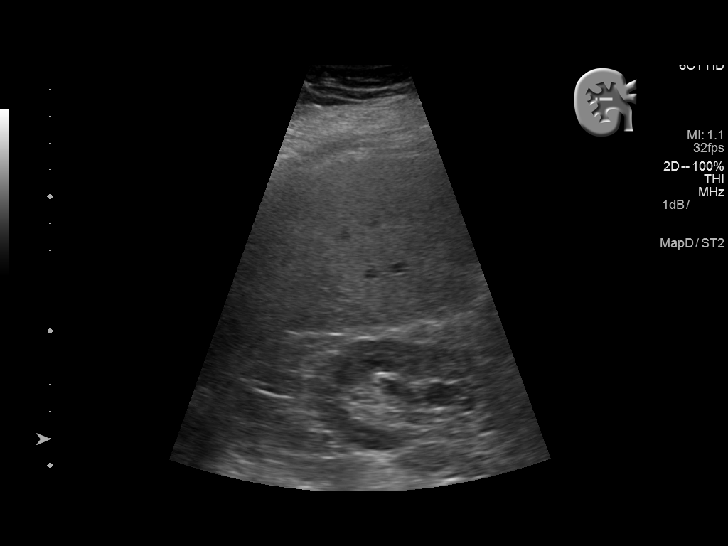
[im 19/50]
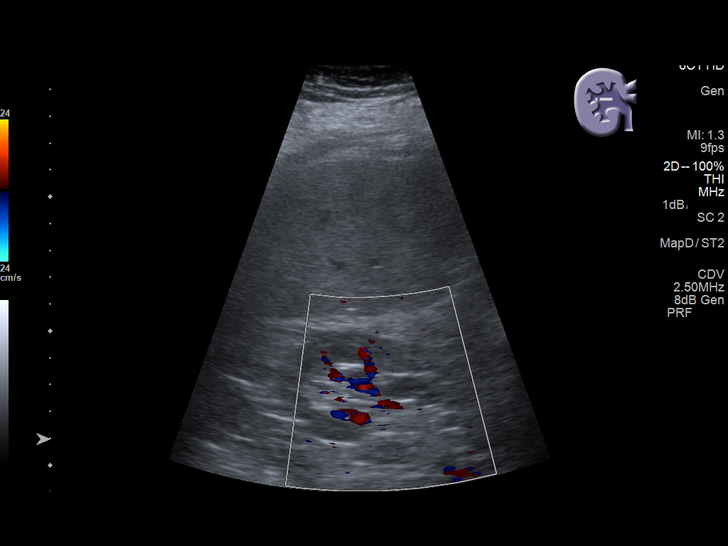
[im 23/50]
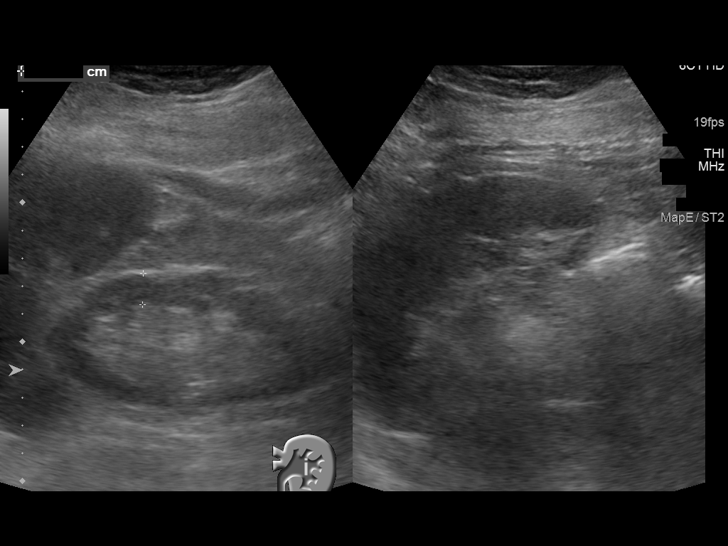
[im 27/50]
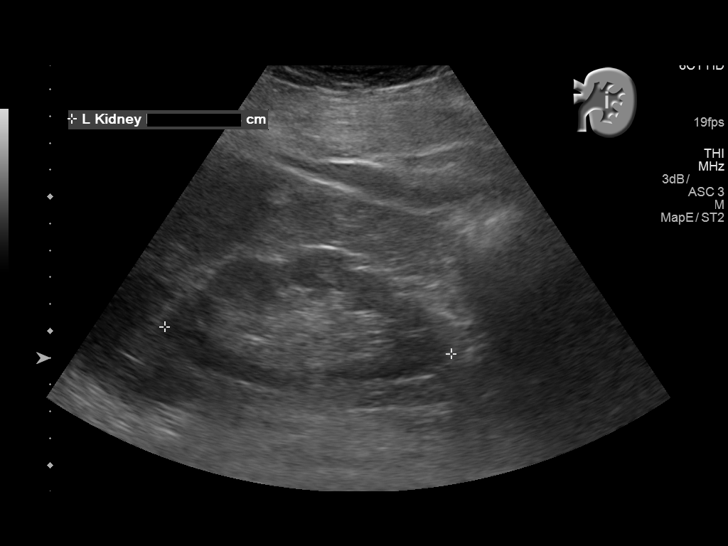
[im 31/50]
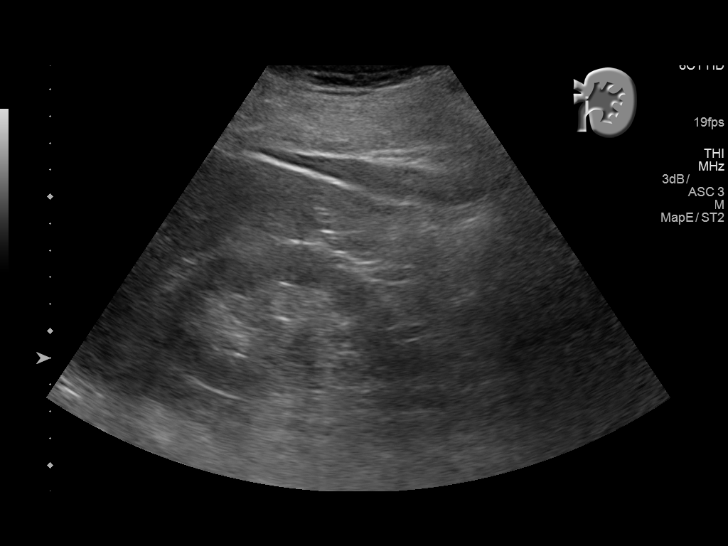
[im 33/50]
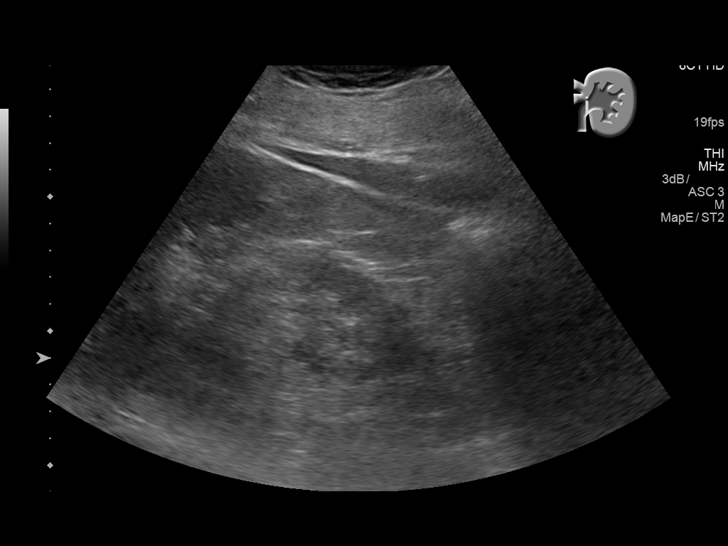
[im 37/50]
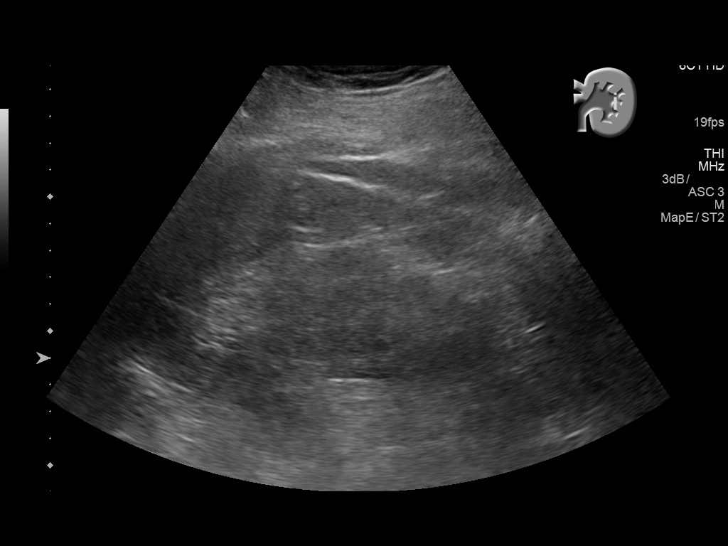
[im 41/50]
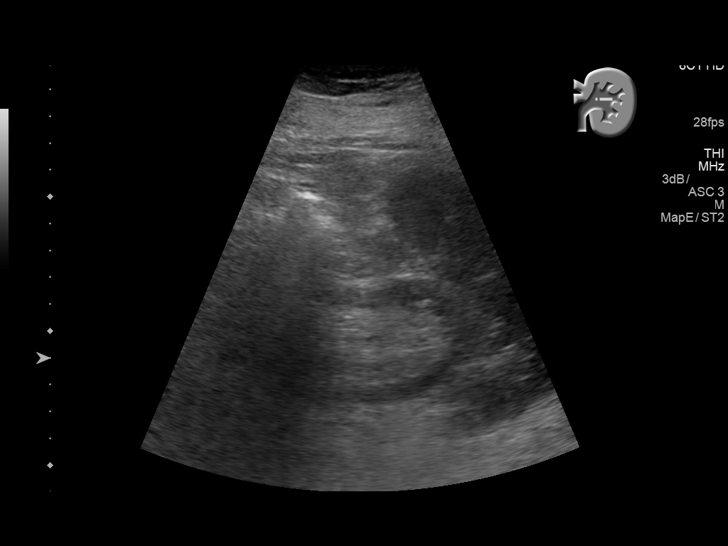
[im 45/50]
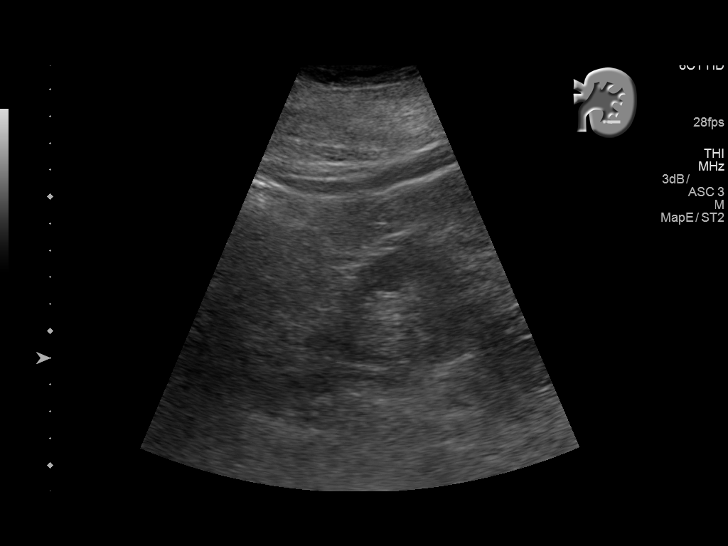
[im 50/50]
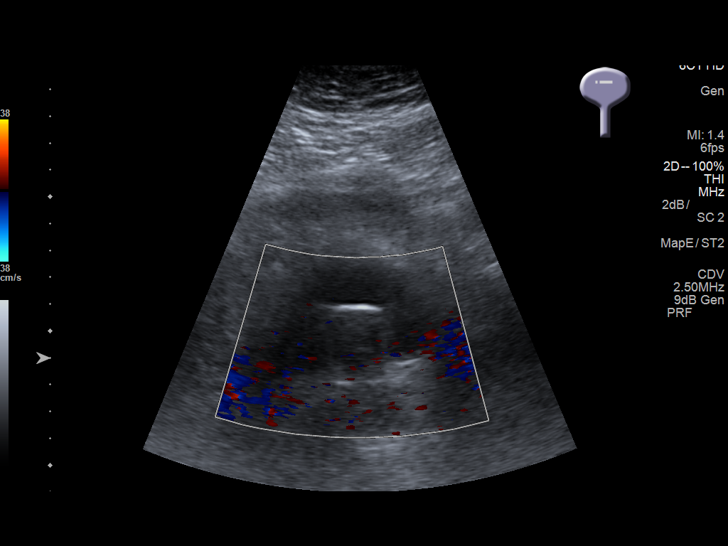

[14 of 25 positions shown; findings below may reference images not displayed]

FINDINGS: Right Kidney:

Length: 10.8 cm. Mild renal cortical thinning and prominent renal
sinus fat. No hydronephrosis or focal cortical lesion.

Left Kidney:

Length: 10.7 cm. Mild renal cortical thinning and prominent renal
sinus fat. No hydronephrosis or focal cortical lesion.

Bladder:

Decompressed by Foley catheter.  No apparent abnormality.
IMPRESSION: 1. No hydronephrosis. The bladder is decompressed by the Foley
catheter.
2. Both kidneys are normal in size, although demonstrate mild
cortical thinning and prominent sinus fat.
